# Patient Record
Sex: Female | Born: 1960
Health system: Southern US, Community
[De-identification: ages and names within clinical notes are randomized; demographics above are authoritative.]

## PROBLEM LIST (undated history)

## (undated) DIAGNOSIS — C801 Malignant (primary) neoplasm, unspecified: Secondary | ICD-10-CM

## (undated) HISTORY — PX: KNEE ARTHROSCOPY: SUR90

## (undated) HISTORY — PX: MOLE REMOVAL: SHX2046

---

## 2005-03-04 ENCOUNTER — Ambulatory Visit: Payer: Self-pay | Admitting: Family Medicine

## 2006-03-24 ENCOUNTER — Ambulatory Visit: Payer: Self-pay | Admitting: Family Medicine

## 2006-04-04 ENCOUNTER — Ambulatory Visit: Payer: Self-pay | Admitting: Family Medicine

## 2011-01-26 ENCOUNTER — Ambulatory Visit: Payer: Self-pay | Admitting: Internal Medicine

## 2011-04-26 ENCOUNTER — Ambulatory Visit: Payer: Self-pay | Admitting: Internal Medicine

## 2012-06-28 ENCOUNTER — Ambulatory Visit: Payer: Self-pay | Admitting: Internal Medicine

## 2013-07-03 ENCOUNTER — Ambulatory Visit: Payer: Self-pay | Admitting: Internal Medicine

## 2014-10-09 LAB — LIPID PANEL
Cholesterol: 203 mg/dL — AB (ref 0–200)
HDL: 62 mg/dL (ref 35–70)
LDL Cholesterol: 126 mg/dL
Triglycerides: 75 mg/dL (ref 40–160)

## 2014-10-09 LAB — BASIC METABOLIC PANEL
BUN: 19 mg/dL (ref 4–21)
Creatinine: 0.8 mg/dL (ref <=1.1)

## 2014-10-09 LAB — HM PAP SMEAR: HM Pap smear: NORMAL

## 2014-10-09 LAB — HEMOGLOBIN A1C: Hgb A1c MFr Bld: 5.8 % (ref 4.0–6.0)

## 2014-10-09 LAB — TSH: TSH: 2.6 u[IU]/mL (ref <=5.90)

## 2014-10-20 LAB — HM MAMMOGRAPHY: HM Mammogram: NORMAL

## 2014-10-29 ENCOUNTER — Other Ambulatory Visit: Payer: Self-pay

## 2014-10-29 DIAGNOSIS — Z1231 Encounter for screening mammogram for malignant neoplasm of breast: Secondary | ICD-10-CM

## 2014-11-19 ENCOUNTER — Ambulatory Visit
Admission: RE | Admit: 2014-11-19 | Discharge: 2014-11-19 | Disposition: A | Discharge status: Home / Self Care | Payer: BLUE CROSS/BLUE SHIELD | Source: Ambulatory Visit | Attending: Internal Medicine | Admitting: Internal Medicine

## 2014-11-19 DIAGNOSIS — R922 Inconclusive mammogram: Secondary | ICD-10-CM | POA: Diagnosis not present

## 2014-11-19 DIAGNOSIS — Z1231 Encounter for screening mammogram for malignant neoplasm of breast: Secondary | ICD-10-CM | POA: Insufficient documentation

## 2015-02-19 ENCOUNTER — Other Ambulatory Visit: Payer: Self-pay | Admitting: Internal Medicine

## 2015-02-25 LAB — FECAL OCCULT BLOOD, IMMUNOCHEMICAL: Fecal Occult Bld: NEGATIVE

## 2015-05-03 ENCOUNTER — Encounter: Payer: Self-pay | Admitting: Internal Medicine

## 2015-05-06 ENCOUNTER — Other Ambulatory Visit: Payer: Self-pay | Admitting: Internal Medicine

## 2015-05-06 ENCOUNTER — Encounter: Payer: Self-pay | Admitting: Internal Medicine

## 2015-05-06 DIAGNOSIS — R739 Hyperglycemia, unspecified: Secondary | ICD-10-CM | POA: Insufficient documentation

## 2015-05-06 DIAGNOSIS — L659 Nonscarring hair loss, unspecified: Secondary | ICD-10-CM | POA: Insufficient documentation

## 2015-05-06 DIAGNOSIS — K219 Gastro-esophageal reflux disease without esophagitis: Secondary | ICD-10-CM | POA: Insufficient documentation

## 2015-05-06 DIAGNOSIS — L918 Other hypertrophic disorders of the skin: Secondary | ICD-10-CM | POA: Insufficient documentation

## 2015-05-06 DIAGNOSIS — I10 Essential (primary) hypertension: Secondary | ICD-10-CM | POA: Insufficient documentation

## 2015-05-07 ENCOUNTER — Other Ambulatory Visit: Payer: Self-pay

## 2015-05-07 ENCOUNTER — Other Ambulatory Visit: Payer: Self-pay | Admitting: Internal Medicine

## 2015-05-07 MED ORDER — HYDROCHLOROTHIAZIDE 25 MG PO TABS: 25.0000 mg | ORAL_TABLET | Freq: Every day | ORAL | Status: DC

## 2015-10-09 ENCOUNTER — Other Ambulatory Visit: Payer: Self-pay | Admitting: Internal Medicine

## 2015-10-09 DIAGNOSIS — Z1231 Encounter for screening mammogram for malignant neoplasm of breast: Secondary | ICD-10-CM

## 2015-10-24 DIAGNOSIS — Z1283 Encounter for screening for malignant neoplasm of skin: Secondary | ICD-10-CM | POA: Diagnosis not present

## 2015-10-24 DIAGNOSIS — D0359 Melanoma in situ of other part of trunk: Secondary | ICD-10-CM | POA: Diagnosis not present

## 2015-10-24 DIAGNOSIS — L918 Other hypertrophic disorders of the skin: Secondary | ICD-10-CM | POA: Diagnosis not present

## 2015-10-24 DIAGNOSIS — D2372 Other benign neoplasm of skin of left lower limb, including hip: Secondary | ICD-10-CM | POA: Diagnosis not present

## 2015-10-24 DIAGNOSIS — D485 Neoplasm of uncertain behavior of skin: Secondary | ICD-10-CM | POA: Diagnosis not present

## 2015-11-06 ENCOUNTER — Ambulatory Visit (INDEPENDENT_AMBULATORY_CARE_PROVIDER_SITE_OTHER): Payer: BLUE CROSS/BLUE SHIELD | Admitting: Internal Medicine

## 2015-11-06 ENCOUNTER — Encounter: Payer: Self-pay | Admitting: Internal Medicine

## 2015-11-06 VITALS — BP 118/82 | HR 98 | Resp 16 | Ht 61.0 in | Wt 162.0 lb

## 2015-11-06 DIAGNOSIS — Z1211 Encounter for screening for malignant neoplasm of colon: Secondary | ICD-10-CM | POA: Diagnosis not present

## 2015-11-06 DIAGNOSIS — K219 Gastro-esophageal reflux disease without esophagitis: Secondary | ICD-10-CM

## 2015-11-06 DIAGNOSIS — Z0000 Encounter for general adult medical examination without abnormal findings: Secondary | ICD-10-CM

## 2015-11-06 DIAGNOSIS — I10 Essential (primary) hypertension: Secondary | ICD-10-CM

## 2015-11-06 DIAGNOSIS — Z1159 Encounter for screening for other viral diseases: Secondary | ICD-10-CM | POA: Diagnosis not present

## 2015-11-06 DIAGNOSIS — R739 Hyperglycemia, unspecified: Secondary | ICD-10-CM

## 2015-11-06 LAB — POCT URINALYSIS DIPSTICK
Bilirubin, UA: NEGATIVE
Blood, UA: NEGATIVE
Glucose, UA: NEGATIVE
Ketones, UA: NEGATIVE
Leukocytes, UA: NEGATIVE
Nitrite, UA: NEGATIVE
Protein, UA: NEGATIVE
Spec Grav, UA: 1.01
Urobilinogen, UA: 0.2
pH, UA: 6

## 2015-11-06 NOTE — Patient Instructions (Addendum)
Calcium 1200 mg per day Vitamin D 800 IU per day  Colonoscopy A colonoscopy is an exam to look at the entire large intestine (colon). This exam can help find problems such as tumors, polyps, inflammation, and areas of bleeding. The exam takes about 1 hour.  LET [facility] CARE PROVIDER KNOW ABOUT:   Any allergies you have.  All medicines you are taking, including vitamins, herbs, eye drops, creams, and over-the-counter medicines.  Previous problems you or members of your family have had with the use of anesthetics.  Any blood disorders you have.  Previous surgeries you have had.  Medical conditions you have. RISKS AND COMPLICATIONS  Generally, this is a safe procedure. However, as with any procedure, complications can occur. Possible complications include:  Bleeding.  Tearing or rupture of the colon wall.  Reaction to medicines given during the exam.  Infection (rare). BEFORE THE PROCEDURE   Ask your health care provider about changing or stopping your regular medicines.  You may be prescribed an oral bowel prep. This involves drinking a large amount of medicated liquid, starting the day before your procedure. The liquid will cause you to have multiple loose stools until your stool is almost clear or light green. This cleans out your colon in preparation for the procedure.  Do not eat or drink anything else once you have started the bowel prep, unless your health care provider tells you it is safe to do so.  Arrange for someone to drive you home after the procedure. PROCEDURE   You will be given medicine to help you relax (sedative).  You will lie on your side with your knees bent.  A long, flexible tube with a light and camera on the end (colonoscope) will be inserted through the rectum and into the colon. The camera sends video back to a computer screen as it moves through the colon. The colonoscope also releases carbon dioxide gas to inflate the colon. This helps your  health care provider see the area better.  During the exam, your health care provider may take a small tissue sample (biopsy) to be examined under a microscope if any abnormalities are found.  The exam is finished when the entire colon has been viewed. AFTER THE PROCEDURE   Do not drive for 24 hours after the exam.  You may have a small amount of blood in your stool.  You may pass moderate amounts of gas and have mild abdominal cramping or bloating. This is caused by the gas used to inflate your colon during the exam.  Ask when your test results will be ready and how you will get your results. Make sure you get your test results.   This information is not intended to replace advice given to you by your health care provider. Make sure you discuss any questions you have with your health care provider.   Document Released: 06/04/2000 Document Revised: 03/28/2013 Document Reviewed: 02/12/2013 Elsevier Interactive Patient Education [employer].

## 2015-11-06 NOTE — Progress Notes (Signed)
Date:  11/06/2015   Name:  Debra Villa   DOB:  1960-12-08   MRN:  [id number]   Chief Complaint: Annual Exam Debra Villa is a 55 y.o. female who presents today for her Complete Annual Exam. She feels well. She reports exercising none. She reports she is sleeping well. She denies any breast problems.  Mammogram is scheduled.  She is still very hesitant to have a colonoscopy.  She will check on Cologuard coverage.  Hypertension This is a chronic problem. The current episode started more than 1 year ago. The problem is unchanged. The problem is controlled. Pertinent negatives include no chest pain, headaches, palpitations or shortness of breath. Past treatments include diuretics. The current treatment provides significant improvement. There are no compliance problems.  There is no history of kidney disease, CAD/MI, CVA or PVD.  Gastroesophageal Reflux She reports no abdominal pain, no chest pain, no coughing or no wheezing. This is a chronic problem. The problem occurs rarely. The problem has been resolved. Pertinent negatives include no fatigue. She has tried a histamine-2 antagonist for the symptoms.     Review of Systems  Constitutional: Negative for fever, chills and fatigue.  HENT: Negative for congestion, hearing loss, tinnitus, trouble swallowing and voice change.   Eyes: Negative for visual disturbance.  Respiratory: Negative for cough, chest tightness, shortness of breath and wheezing.   Cardiovascular: Negative for chest pain, palpitations and leg swelling.  Gastrointestinal: Negative for vomiting, abdominal pain, diarrhea and constipation.  Endocrine: Negative for polydipsia and polyuria.  Genitourinary: Negative for dysuria, frequency, vaginal bleeding, vaginal discharge and genital sores.  Musculoskeletal: Negative for joint swelling, arthralgias and gait problem.  Skin: Negative for color change and rash.  Allergic/Immunologic: Negative for environmental  allergies.  Neurological: Negative for dizziness, tremors, light-headedness and headaches.  Hematological: Negative for adenopathy. Does not bruise/bleed easily.  Psychiatric/Behavioral: Negative for sleep disturbance and dysphoric mood. The patient is not nervous/anxious.     Patient Active Problem List   Diagnosis Date Noted  . 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 Alopecia 05/06/2015  . Essential (primary) hypertension 05/06/2015  . Gastro-esophageal reflux disease without esophagitis 05/06/2015  . Blood glucose elevated 05/06/2015    Prior to Admission medications   Medication Sig Start Date End Date Taking? Authorizing Provider  Biotin 5 MG CAPS Take by mouth.   Yes Historical Provider, MD  Famotidine (PEPCID AC MAXIMUM STRENGTH) 20 MG CHEW Chew 1 tablet by mouth daily.   Yes Historical Provider, MD  hydrochlorothiazide (HYDRODIURIL) 25 MG tablet Take 1 tablet (25 mg total) by mouth daily. 05/07/15  Yes Glean Hess, MD    No Known Allergies  Past Surgical History  Procedure Laterality Date  . Knee arthroscopy      Social History  Substance Use Topics  . Smoking status: Never Smoker   . Smokeless tobacco: None  . Alcohol Use: 1.2 oz/week    2 Standard drinks or equivalent per week    Medication list has been reviewed and updated.  Physical Exam  Constitutional: She is oriented to person, place, and time. She appears well-developed and well-nourished. No distress.  HENT:  Head: Normocephalic and atraumatic.  Right Ear: Tympanic membrane and ear canal normal.  Left Ear: Tympanic membrane and ear canal normal.  Nose: Right sinus exhibits no maxillary sinus tenderness. Left sinus exhibits no maxillary sinus tenderness.  Mouth/Throat: Uvula is midline and oropharynx is clear and moist.  Eyes: Conjunctivae and EOM are normal. Right  eye exhibits no discharge. Left eye exhibits no discharge. No scleral icterus.  Neck: Normal range of motion. Carotid bruit is not present. No  erythema present. No thyromegaly present.  Cardiovascular: Normal rate, regular rhythm, normal heart sounds and normal pulses.   Pulmonary/Chest: Effort normal. No respiratory distress. She has no wheezes. Right breast exhibits no mass, no nipple discharge, no skin change and no tenderness. Left breast exhibits no mass, no nipple discharge, no skin change and no tenderness.  Abdominal: Soft. Bowel sounds are normal. There is no hepatosplenomegaly. There is no tenderness. There is no CVA tenderness.  Musculoskeletal: Normal range of motion.  Lymphadenopathy:    She has no cervical adenopathy.    She has no axillary adenopathy.  Neurological: She is alert and oriented to person, place, and time. She has normal reflexes. No cranial nerve deficit or sensory deficit.  Skin: Skin is warm and dry. No rash noted.  Skin biopsy site on left thigh and mid back.  Psychiatric: She has a normal mood and affect. Her speech is normal and behavior is normal. Thought content normal.  Nursing note and vitals reviewed.   BP 118/82 mmHg  Pulse 98  Resp 16  Ht 5\' 1"  (1.549 m)  Wt 162 lb (73.483 kg)  BMI 30.63 kg/m2  SpO2 98%  Assessment and Plan: 1. Annual physical exam Recommend regular exercise and Calcium + D supplement - Lipid panel  2. Essential (primary) hypertension controleed - CBC with Differential/Platelet - TSH - POCT urinalysis dipstick  3. Gastro-esophageal reflux disease without esophagitis Controlled with otc medication - CBC with Differential/Platelet  4. Blood glucose elevated Continue healthy diet - Comprehensive metabolic panel - Hemoglobin A1c  5. Colon cancer screening Check on cologuard coverage  6. Need for hepatitis C screening test - Hepatitis C antibody   Halina Maidens, MD [facility]  11/06/2015

## 2015-11-07 LAB — TSH: TSH: 2.06 u[IU]/mL (ref 0.450–4.500)

## 2015-11-07 LAB — COMPREHENSIVE METABOLIC PANEL
ALT: 31 IU/L (ref 0–32)
AST: 22 IU/L (ref 0–40)
Albumin/Globulin Ratio: 1.7 (ref 1.2–2.2)
Albumin: 4.8 g/dL (ref 3.5–5.5)
Alkaline Phosphatase: 102 IU/L (ref 39–117)
BUN/Creatinine Ratio: 24 — ABNORMAL HIGH (ref 9–23)
BUN: 17 mg/dL (ref 6–24)
Bilirubin Total: 0.3 mg/dL (ref 0.0–1.2)
CO2: 25 mmol/L (ref 18–29)
Calcium: 10 mg/dL (ref 8.7–10.2)
Chloride: 98 mmol/L (ref 96–106)
Creatinine, Ser: 0.72 mg/dL (ref 0.57–1.00)
GFR calc Af Amer: 108 mL/min/{1.73_m2} (ref >59)
GFR calc non Af Amer: 93 mL/min/{1.73_m2} (ref >59)
Globulin, Total: 2.8 g/dL (ref 1.5–4.5)
Glucose: 87 mg/dL (ref 65–99)
Potassium: 3.4 mmol/L — ABNORMAL LOW (ref 3.5–5.2)
Sodium: 143 mmol/L (ref 134–144)
Total Protein: 7.6 g/dL (ref 6.0–8.5)

## 2015-11-07 LAB — CBC WITH DIFFERENTIAL/PLATELET
Basophils Absolute: 0 10*3/uL (ref 0.0–0.2)
Basos: 0 %
EOS (ABSOLUTE): 0.3 10*3/uL (ref 0.0–0.4)
Eos: 5 %
Hematocrit: 43.2 % (ref 34.0–46.6)
Hemoglobin: 14.3 g/dL (ref 11.1–15.9)
Immature Grans (Abs): 0 10*3/uL (ref 0.0–0.1)
Immature Granulocytes: 0 %
Lymphocytes Absolute: 1.3 10*3/uL (ref 0.7–3.1)
Lymphs: 23 %
MCH: 29.3 pg (ref 26.6–33.0)
MCHC: 33.1 g/dL (ref 31.5–35.7)
MCV: 89 fL (ref 79–97)
Monocytes Absolute: 0.5 10*3/uL (ref 0.1–0.9)
Monocytes: 8 %
Neutrophils Absolute: 3.5 10*3/uL (ref 1.4–7.0)
Neutrophils: 64 %
Platelets: 269 10*3/uL (ref 150–379)
RBC: 4.88 x10E6/uL (ref 3.77–5.28)
RDW: 14.2 % (ref 12.3–15.4)
WBC: 5.5 10*3/uL (ref 3.4–10.8)

## 2015-11-07 LAB — LIPID PANEL
Chol/HDL Ratio: 3.4 ratio units (ref 0.0–4.4)
Cholesterol, Total: 217 mg/dL — ABNORMAL HIGH (ref 100–199)
HDL: 64 mg/dL (ref >39)
LDL Calculated: 128 mg/dL — ABNORMAL HIGH (ref 0–99)
Triglycerides: 123 mg/dL (ref 0–149)
VLDL Cholesterol Cal: 25 mg/dL (ref 5–40)

## 2015-11-07 LAB — HEMOGLOBIN A1C
Est. average glucose Bld gHb Est-mCnc: 120 mg/dL
Hgb A1c MFr Bld: 5.8 % — ABNORMAL HIGH (ref 4.8–5.6)

## 2015-11-07 LAB — HEPATITIS C ANTIBODY: Hep C Virus Ab: 0.1 s/co ratio (ref 0.0–0.9)

## 2015-11-18 ENCOUNTER — Encounter: Payer: Self-pay | Admitting: Internal Medicine

## 2015-11-18 ENCOUNTER — Ambulatory Visit (INDEPENDENT_AMBULATORY_CARE_PROVIDER_SITE_OTHER): Payer: BLUE CROSS/BLUE SHIELD | Admitting: Internal Medicine

## 2015-11-18 VITALS — BP 142/86 | HR 92 | Temp 100.8°F | Resp 16 | Ht 61.0 in | Wt 162.0 lb

## 2015-11-18 DIAGNOSIS — J019 Acute sinusitis, unspecified: Secondary | ICD-10-CM

## 2015-11-18 MED ORDER — AMOXICILLIN-POT CLAVULANATE 875-125 MG PO TABS: 1.0000 | ORAL_TABLET | Freq: Two times a day (BID) | ORAL | Status: DC

## 2015-11-18 MED ORDER — GUAIFENESIN-CODEINE 100-10 MG/5ML PO SYRP
5.0000 mL | ORAL_SOLUTION | Freq: Three times a day (TID) | ORAL | Status: DC | PRN
Start: 2015-11-18 — End: 2016-06-06

## 2015-11-18 NOTE — Patient Instructions (Signed)

## 2015-11-18 NOTE — Progress Notes (Signed)
Date:  11/18/2015   Name:  Debra Villa   DOB:  22-Mar-1961   MRN:  [id number]   Chief Complaint: Cough; Fever; Headache; and Nausea Cough This is a new problem. The current episode started in the past 7 days. The cough is productive of sputum. Associated symptoms include a fever and headaches.  Fever  This is a new problem. The maximum temperature noted was 100 to 100.9 F. Associated symptoms include coughing and headaches. She has tried acetaminophen and NSAIDs for the symptoms.  Headache  This is a new problem. The current episode started in the past 7 days. The problem occurs constantly. Associated symptoms include coughing and a fever.   She has started blowing out green mucus and coughing up the same.  Her fever is responding to tylenol and her headache is improved with sudafed.   Review of Systems  Constitutional: Positive for fever.  Respiratory: Positive for cough.   Neurological: Positive for headaches.    Patient Active Problem List   Diagnosis Date Noted  . 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 Alopecia 05/06/2015  . Essential (primary) hypertension 05/06/2015  . Gastro-esophageal reflux disease without esophagitis 05/06/2015  . Blood glucose elevated 05/06/2015    Prior to Admission medications   Medication Sig Start Date End Date Taking? Authorizing Provider  Biotin 5 MG CAPS Take by mouth.   Yes Historical Provider, MD  Famotidine (PEPCID AC MAXIMUM STRENGTH) 20 MG CHEW Chew 1 tablet by mouth daily.   Yes Historical Provider, MD  hydrochlorothiazide (HYDRODIURIL) 25 MG tablet Take 1 tablet (25 mg total) by mouth daily. 05/07/15  Yes Glean Hess, MD    No Known Allergies  Past Surgical History  Procedure Laterality Date  . Knee arthroscopy      Social History  Substance Use Topics  . Smoking status: Never Smoker   . Smokeless tobacco: None  . Alcohol Use: 1.2 oz/week    2 Standard drinks or equivalent per week     Medication list has been reviewed  and updated.   Physical Exam  Constitutional: She is oriented to person, place, and time. She appears well-developed and well-nourished.  HENT:  Right Ear: External ear and ear canal normal. Tympanic membrane is not erythematous and not retracted.  Left Ear: External ear and ear canal normal. Tympanic membrane is not erythematous and not retracted.  Nose: Right sinus exhibits maxillary sinus tenderness and frontal sinus tenderness. Left sinus exhibits maxillary sinus tenderness and frontal sinus tenderness.  Mouth/Throat: Uvula is midline and mucous membranes are normal. No oral lesions. Posterior oropharyngeal erythema present. No oropharyngeal exudate.  Neck: Normal range of motion. No thyromegaly present.  Cardiovascular: Normal rate, regular rhythm and normal heart sounds.   Pulmonary/Chest: Breath sounds normal. She has no wheezes. She has no rales.  Lymphadenopathy:    She has no cervical adenopathy.  Neurological: She is alert and oriented to person, place, and time.  Nursing note and vitals reviewed.   BP 142/86 mmHg  Pulse 92  Temp(Src) 100.8 F (38.2 C) (Oral)  Resp 16  Ht 5\' 1"  (1.549 m)  Wt 162 lb (73.483 kg)  BMI 30.63 kg/m2  SpO2 97%  Assessment and Plan: 1. Acute sinusitis, recurrence not specified, unspecified location Continue sudafed and tylenol Increase fluids Pt declines flonase Rx - she can not tolerate nasal sprays  - amoxicillin-clavulanate (AUGMENTIN) 875-125 MG tablet; Take 1 tablet by mouth 2 (two) times daily.  Dispense: 20 tablet; Refill: 0 -  guaiFENesin-codeine (ROBITUSSIN AC) 100-10 MG/5ML syrup; Take 5 mLs by mouth 3 (three) times daily as needed for cough.  Dispense: 150 mL; Refill: 0   Halina Maidens, MD [facility]  11/18/2015

## 2015-11-20 ENCOUNTER — Ambulatory Visit
Admission: RE | Admit: 2015-11-20 | Discharge: 2015-11-20 | Disposition: A | Discharge status: Home / Self Care | Payer: BLUE CROSS/BLUE SHIELD | Source: Ambulatory Visit | Attending: Internal Medicine | Admitting: Internal Medicine

## 2015-11-20 DIAGNOSIS — Z1231 Encounter for screening mammogram for malignant neoplasm of breast: Secondary | ICD-10-CM | POA: Insufficient documentation

## 2015-11-26 ENCOUNTER — Other Ambulatory Visit: Payer: Self-pay | Admitting: Internal Medicine

## 2015-11-26 DIAGNOSIS — D2272 Melanocytic nevi of left lower limb, including hip: Secondary | ICD-10-CM | POA: Diagnosis not present

## 2015-11-26 DIAGNOSIS — D485 Neoplasm of uncertain behavior of skin: Secondary | ICD-10-CM | POA: Diagnosis not present

## 2015-11-26 DIAGNOSIS — D0359 Melanoma in situ of other part of trunk: Secondary | ICD-10-CM | POA: Diagnosis not present

## 2015-11-26 MED ORDER — HYDROCODONE-HOMATROPINE 5-1.5 MG/5ML PO SYRP: 5.0000 mL | ORAL_SOLUTION | Freq: Four times a day (QID) | ORAL | Status: DC | PRN

## 2015-12-08 ENCOUNTER — Other Ambulatory Visit: Payer: Self-pay | Admitting: Internal Medicine

## 2015-12-08 DIAGNOSIS — Z1212 Encounter for screening for malignant neoplasm of rectum: Secondary | ICD-10-CM | POA: Diagnosis not present

## 2015-12-08 DIAGNOSIS — Z1211 Encounter for screening for malignant neoplasm of colon: Secondary | ICD-10-CM | POA: Diagnosis not present

## 2015-12-09 LAB — COLOGUARD

## 2015-12-10 ENCOUNTER — Ambulatory Visit (INDEPENDENT_AMBULATORY_CARE_PROVIDER_SITE_OTHER): Payer: BLUE CROSS/BLUE SHIELD | Admitting: Internal Medicine

## 2015-12-10 ENCOUNTER — Encounter: Payer: Self-pay | Admitting: Internal Medicine

## 2015-12-10 VITALS — BP 122/80 | HR 91 | Temp 98.1°F | Resp 16 | Ht 61.0 in | Wt 162.0 lb

## 2015-12-10 DIAGNOSIS — J029 Acute pharyngitis, unspecified: Secondary | ICD-10-CM

## 2015-12-10 MED ORDER — AZITHROMYCIN 250 MG PO TABS: ORAL_TABLET | ORAL | Status: DC

## 2015-12-10 NOTE — Progress Notes (Signed)
Date:  12/10/2015   Name:  Debra Villa   DOB:  06/11/1961   MRN:  [id number]   Chief Complaint: Sore Throat Sore Throat  This is a new problem. The current episode started in the past 7 days. The problem has been unchanged. Neither side of throat is experiencing more pain than the other. There has been no fever. The pain is mild. Associated symptoms include coughing and trouble swallowing. Pertinent negatives include no ear pain, headaches, hoarse voice or shortness of breath. She has had exposure to strep. She has tried acetaminophen for the symptoms. The treatment provided mild relief.    Review of Systems  Constitutional: Negative for fever, chills and fatigue.  HENT: Positive for sore throat and trouble swallowing. Negative for ear pain, hoarse voice, sinus pressure and tinnitus.   Respiratory: Positive for cough. Negative for chest tightness and shortness of breath.   Cardiovascular: Negative for chest pain, palpitations and leg swelling.  Neurological: Negative for dizziness and headaches.    Patient Active Problem List   Diagnosis Date Noted  . 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 Alopecia 05/06/2015  . Essential (primary) hypertension 05/06/2015  . Gastro-esophageal reflux disease without esophagitis 05/06/2015  . Blood glucose elevated 05/06/2015    Prior to Admission medications   Medication Sig Start Date End Date Taking? Authorizing Provider  Biotin 5 MG CAPS Take by mouth.   Yes Historical Provider, MD  Famotidine (PEPCID AC MAXIMUM STRENGTH) 20 MG CHEW Chew 1 tablet by mouth daily.   Yes Historical Provider, MD  guaiFENesin-codeine (ROBITUSSIN AC) 100-10 MG/5ML syrup Take 5 mLs by mouth 3 (three) times daily as needed for cough. 11/18/15  Yes Glean Hess, MD  hydrochlorothiazide (HYDRODIURIL) 25 MG tablet TAKE ONE (1) TABLET BY MOUTH EVERY DAY 12/08/15  Yes Glean Hess, MD  HYDROcodone-homatropine [facility]) 5-1.5 MG/5ML syrup Take 5 mLs by mouth every 6 (six)  hours as needed for cough. 11/26/15  Yes Glean Hess, MD    No Known Allergies  Past Surgical History  Procedure Laterality Date  . Knee arthroscopy    . Mole removal      Social History  Substance Use Topics  . Smoking status: Never Smoker   . Smokeless tobacco: None  . Alcohol Use: 1.2 oz/week    2 Standard drinks or equivalent per week     Medication list has been reviewed and updated.   Physical Exam  Constitutional: She is oriented to person, place, and time. She appears well-developed and well-nourished.  HENT:  Right Ear: External ear and ear canal normal. Tympanic membrane is not erythematous and not retracted.  Left Ear: External ear and ear canal normal. Tympanic membrane is not erythematous and not retracted.  Nose: Right sinus exhibits no maxillary sinus tenderness and no frontal sinus tenderness. Left sinus exhibits no maxillary sinus tenderness and no frontal sinus tenderness.  Mouth/Throat: Uvula is midline and mucous membranes are normal. No oral lesions. Posterior oropharyngeal edema and posterior oropharyngeal erythema present. No oropharyngeal exudate.    Cardiovascular: Normal rate, regular rhythm and normal heart sounds.   Pulmonary/Chest: Breath sounds normal. She has no wheezes. She has no rales.  Lymphadenopathy:    She has no cervical adenopathy.  Neurological: She is alert and oriented to person, place, and time.    BP 122/80 mmHg  Pulse 91  Temp(Src) 98.1 F (36.7 C)  Resp 16  Ht 5\' 1"  (1.549 m)  Wt 162 lb (73.483 kg)  BMI 30.63 kg/m2  SpO2 100%  Assessment and Plan: 1. Pharyngitis With tonsil stones contributing to discomfort Continue tylenol as needed Gargle with saline bid - azithromycin (ZITHROMAX Z-PAK) 250 MG tablet; Take 2 tablets on day #1 and then 1 tablet daily for 4 more days.  Dispense: 6 each; Refill: 0   Halina Maidens, MD [facility]  12/10/2015

## 2015-12-17 LAB — COLOGUARD: Cologuard: NEGATIVE

## 2015-12-25 ENCOUNTER — Telehealth: Payer: Self-pay

## 2015-12-25 NOTE — Telephone Encounter (Signed)
Advised...Marland KitchenMarland KitchenCologaurd Neg repeat in 3 years unless complications arise.

## 2016-06-06 ENCOUNTER — Ambulatory Visit
Admission: EM | Admit: 2016-06-06 | Discharge: 2016-06-06 | Disposition: A | Discharge status: Home / Self Care | Payer: BLUE CROSS/BLUE SHIELD | Attending: Family Medicine | Admitting: Family Medicine

## 2016-06-06 ENCOUNTER — Encounter: Payer: Self-pay | Admitting: Gynecology

## 2016-06-06 DIAGNOSIS — J0140 Acute pansinusitis, unspecified: Secondary | ICD-10-CM | POA: Diagnosis not present

## 2016-06-06 MED ORDER — AMOXICILLIN-POT CLAVULANATE 875-125 MG PO TABS: 1.0000 | ORAL_TABLET | Freq: Two times a day (BID) | ORAL | 0 refills | Status: DC

## 2016-06-06 MED ORDER — AMOXICILLIN-POT CLAVULANATE 875-125 MG PO TABS: 1.0000 | ORAL_TABLET | Freq: Two times a day (BID) | ORAL | 0 refills | Status: AC

## 2016-06-06 NOTE — Discharge Instructions (Signed)
Start Augmentin twice a day as directed. May take Sudafed 30mg  every 8 hours as needed for congestion. Increase fluid intake to help loosen up mucus. Follow-up in 3 to 4 days with your primary care provider if not improving.

## 2016-06-06 NOTE — ED Provider Notes (Signed)
CSN: [id number]     Arrival date & time 06/06/16  1236 History   First MD Initiated Contact with Patient 06/06/16 1453     Chief Complaint  Patient presents with  . URI   (Consider location/radiation/quality/duration/timing/severity/associated sxs/prior Treatment) 55 year old female presents with nasal congestion, sinus pressure, headache, cough for the past 5 days. Getting worse with low grade fever, chills, laryngitis. Denies any GI symptoms. Has taken OTC Advil sinus with minimal relief. Has history of HTN but forgot to take her BP medication today.    The history is provided by the patient.    History reviewed. No pertinent past medical history. Past Surgical History:  Procedure Laterality Date  . KNEE ARTHROSCOPY    . MOLE REMOVAL     Family History  Problem Relation Age of Onset  . Hypertension Mother   . Hypertension Father   . CAD Father   . Hypertension Brother   . Breast cancer Neg Hx    Social History  Substance Use Topics  . Smoking status: Never Smoker  . Smokeless tobacco: Never Used  . Alcohol use 1.2 oz/week    2 Standard drinks or equivalent per week   OB History    No data available     Review of Systems  Constitutional: Positive for chills, fatigue and fever. Negative for appetite change.  HENT: Positive for congestion, sinus pain, sinus pressure, sore throat and voice change. Negative for ear pain.   Eyes: Negative for discharge.  Respiratory: Positive for cough. Negative for chest tightness, shortness of breath and wheezing.   Cardiovascular: Negative for chest pain.  Gastrointestinal: Negative for abdominal pain, diarrhea, nausea and vomiting.  Musculoskeletal: Negative for neck pain and neck stiffness.  Skin: Negative for rash.  Neurological: Positive for headaches. Negative for dizziness, syncope, weakness and light-headedness.  Hematological: Negative for adenopathy.    Allergies  Patient has no known allergies.  Home Medications    Prior to Admission medications   Medication Sig Start Date End Date Taking? Authorizing Provider  Biotin 5 MG CAPS Take by mouth.   Yes Historical Provider, MD  Famotidine (PEPCID AC MAXIMUM STRENGTH) 20 MG CHEW Chew 1 tablet by mouth daily.   Yes Historical Provider, MD  hydrochlorothiazide (HYDRODIURIL) 25 MG tablet TAKE ONE (1) TABLET BY MOUTH EVERY DAY 12/08/15  Yes Glean Hess, MD  amoxicillin-clavulanate (AUGMENTIN) 875-125 MG tablet Take 1 tablet by mouth every 12 (twelve) hours. 06/06/16 06/13/16  Katy Apo, NP   Meds Ordered and Administered this Visit  Medications - No data to display  BP (!) 154/79 (BP Location: Left Arm)   Pulse 94   Temp 98.5 F (36.9 C) (Oral)   Resp 16   Ht 5\' 1"  (1.549 m)   Wt 155 lb (70.3 kg)   SpO2 99%   BMI 29.29 kg/m  No data found.   Physical Exam  Constitutional: She is oriented to person, place, and time. She appears well-developed and well-nourished. No distress.  HENT:  Head: Normocephalic and atraumatic.  Right Ear: Hearing, tympanic membrane, external ear and ear canal normal.  Left Ear: Hearing, tympanic membrane, external ear and ear canal normal.  Nose: Mucosal edema and rhinorrhea present. Right sinus exhibits maxillary sinus tenderness and frontal sinus tenderness. Left sinus exhibits maxillary sinus tenderness and frontal sinus tenderness.  Mouth/Throat: Uvula is midline and mucous membranes are normal. Posterior oropharyngeal erythema present.  Neck: Normal range of motion. Neck supple.  Cardiovascular: Normal rate, regular  rhythm and normal heart sounds.   Pulmonary/Chest: Effort normal. No respiratory distress. She has no decreased breath sounds. She has no wheezes. She has rhonchi in the right upper field and the left upper field.  Lymphadenopathy:    She has no cervical adenopathy.  Neurological: She is alert and oriented to person, place, and time.  Skin: Skin is warm and dry.  Psychiatric: She has a normal  mood and affect. Her behavior is normal. Judgment and thought content normal.    Urgent Care Course   Clinical Course     Procedures (including critical care time)  Labs Review Labs Reviewed - No data to display  Imaging Review No results found.   Visual Acuity Review  Right Eye Distance:   Left Eye Distance:   Bilateral Distance:    Right Eye Near:   Left Eye Near:    Bilateral Near:         MDM   1. Acute non-recurrent pansinusitis    Recommend start Augmentin 875mg  twice a day as directed. May take Sudafed 30mg  every 8 hours as needed for congestion but continue to monitor BP since Sudafed can increase BP. Recommend increase fluid intake to help loosen up mucus. Follow-up with her primary care provider in 4 to 5 days if not improving.      Katy Apo, NP 06/07/16 1032

## 2016-06-06 NOTE — ED Triage Notes (Signed)
Per patient upper respiration infection x 3 days,

## 2016-07-01 DIAGNOSIS — D229 Melanocytic nevi, unspecified: Secondary | ICD-10-CM | POA: Diagnosis not present

## 2016-07-01 DIAGNOSIS — Z85820 Personal history of malignant melanoma of skin: Secondary | ICD-10-CM | POA: Diagnosis not present

## 2016-07-01 DIAGNOSIS — D485 Neoplasm of uncertain behavior of skin: Secondary | ICD-10-CM | POA: Diagnosis not present

## 2016-07-22 DIAGNOSIS — D485 Neoplasm of uncertain behavior of skin: Secondary | ICD-10-CM | POA: Diagnosis not present

## 2016-07-22 DIAGNOSIS — D229 Melanocytic nevi, unspecified: Secondary | ICD-10-CM | POA: Diagnosis not present

## 2016-07-23 DIAGNOSIS — Z23 Encounter for immunization: Secondary | ICD-10-CM | POA: Diagnosis not present

## 2016-10-27 ENCOUNTER — Other Ambulatory Visit: Payer: Self-pay | Admitting: Internal Medicine

## 2016-10-27 DIAGNOSIS — Z1231 Encounter for screening mammogram for malignant neoplasm of breast: Secondary | ICD-10-CM

## 2016-11-22 ENCOUNTER — Ambulatory Visit
Admission: RE | Admit: 2016-11-22 | Discharge: 2016-11-22 | Disposition: A | Discharge status: Home / Self Care | Payer: BLUE CROSS/BLUE SHIELD | Source: Ambulatory Visit | Attending: Internal Medicine | Admitting: Internal Medicine

## 2016-11-22 DIAGNOSIS — Z1231 Encounter for screening mammogram for malignant neoplasm of breast: Secondary | ICD-10-CM | POA: Insufficient documentation

## 2016-11-22 HISTORY — DX: Malignant (primary) neoplasm, unspecified: C80.1

## 2016-11-24 DIAGNOSIS — Z86018 Personal history of other benign neoplasm: Secondary | ICD-10-CM | POA: Diagnosis not present

## 2016-11-24 DIAGNOSIS — D239 Other benign neoplasm of skin, unspecified: Secondary | ICD-10-CM | POA: Diagnosis not present

## 2016-11-24 DIAGNOSIS — Z85820 Personal history of malignant melanoma of skin: Secondary | ICD-10-CM | POA: Diagnosis not present

## 2016-11-24 DIAGNOSIS — D485 Neoplasm of uncertain behavior of skin: Secondary | ICD-10-CM | POA: Diagnosis not present

## 2016-12-03 ENCOUNTER — Ambulatory Visit (INDEPENDENT_AMBULATORY_CARE_PROVIDER_SITE_OTHER): Payer: BLUE CROSS/BLUE SHIELD | Admitting: Internal Medicine

## 2016-12-03 ENCOUNTER — Telehealth: Payer: Self-pay

## 2016-12-03 ENCOUNTER — Encounter: Payer: Self-pay | Admitting: Internal Medicine

## 2016-12-03 VITALS — BP 132/78 | HR 96 | Ht 61.0 in | Wt 157.0 lb

## 2016-12-03 DIAGNOSIS — R739 Hyperglycemia, unspecified: Secondary | ICD-10-CM | POA: Diagnosis not present

## 2016-12-03 DIAGNOSIS — Z1211 Encounter for screening for malignant neoplasm of colon: Secondary | ICD-10-CM

## 2016-12-03 DIAGNOSIS — Z0000 Encounter for general adult medical examination without abnormal findings: Secondary | ICD-10-CM

## 2016-12-03 DIAGNOSIS — K219 Gastro-esophageal reflux disease without esophagitis: Secondary | ICD-10-CM | POA: Diagnosis not present

## 2016-12-03 DIAGNOSIS — D039 Melanoma in situ, unspecified: Secondary | ICD-10-CM | POA: Diagnosis not present

## 2016-12-03 DIAGNOSIS — I10 Essential (primary) hypertension: Secondary | ICD-10-CM | POA: Diagnosis not present

## 2016-12-03 LAB — POCT URINALYSIS DIPSTICK
Bilirubin, UA: NEGATIVE
Blood, UA: NEGATIVE
Glucose, UA: NEGATIVE
Ketones, UA: NEGATIVE
Leukocytes, UA: NEGATIVE
Nitrite, UA: NEGATIVE
Protein, UA: NEGATIVE
Spec Grav, UA: <=1.005 — AB (ref 1.010–1.025)
Urobilinogen, UA: 0.2 E.U./dL
pH, UA: 5 (ref 5.0–8.0)

## 2016-12-03 MED ORDER — HYDROCHLOROTHIAZIDE 25 MG PO TABS: 25.0000 mg | ORAL_TABLET | Freq: Every day | ORAL | 3 refills | Status: DC

## 2016-12-03 NOTE — Telephone Encounter (Signed)
Error

## 2016-12-03 NOTE — Patient Instructions (Signed)
DASH Eating Plan DASH stands for "Dietary Approaches to Stop Hypertension." The DASH eating plan is a healthy eating plan that has been shown to reduce high blood pressure (hypertension). It may also reduce your risk for type 2 diabetes, heart disease, and stroke. The DASH eating plan may also help with weight loss. What are tips for following this plan? General guidelines  Avoid eating more than 2,300 mg (milligrams) of salt (sodium) a day. If you have hypertension, you may need to reduce your sodium intake to 1,500 mg a day.  Limit alcohol intake to no more than 1 drink a day for nonpregnant women and 2 drinks a day for men. One drink equals 12 oz of beer, 5 oz of wine, or 1 oz of hard liquor.  Work with your health care provider to maintain a healthy body weight or to lose weight. Ask what an ideal weight is for you.  Get at least 30 minutes of exercise that causes your heart to beat faster (aerobic exercise) most days of the week. Activities may include walking, swimming, or biking.  Work with your health care provider or diet and nutrition specialist (dietitian) to adjust your eating plan to your individual calorie needs. Reading food labels  Check food labels for the amount of sodium per serving. Choose foods with less than 5 percent of the Daily Value of sodium. Generally, foods with less than 300 mg of sodium per serving fit into this eating plan.  To find whole grains, look for the word "whole" as the first word in the ingredient list. Shopping  Buy products labeled as "low-sodium" or "no salt added."  Buy fresh foods. Avoid canned foods and premade or frozen meals. Cooking  Avoid adding salt when cooking. Use salt-free seasonings or herbs instead of table salt or sea salt. Check with your health care provider or pharmacist before using salt substitutes.  Do not fry foods. Cook foods using healthy methods such as baking, boiling, grilling, and broiling instead.  Cook with  heart-healthy oils, such as olive, canola, soybean, or sunflower oil. Meal planning   Eat a balanced diet that includes: ? 5 or more servings of fruits and vegetables each day. At each meal, try to fill half of your plate with fruits and vegetables. ? Up to 6-8 servings of whole grains each day. ? Less than 6 oz of lean meat, poultry, or fish each day. A 3-oz serving of meat is about the same size as a deck of cards. One egg equals 1 oz. ? 2 servings of low-fat dairy each day. ? A serving of nuts, seeds, or beans 5 times each week. ? Heart-healthy fats. Healthy fats called Omega-3 fatty acids are found in foods such as flaxseeds and coldwater fish, like sardines, salmon, and mackerel.  Limit how much you eat of the following: ? Canned or prepackaged foods. ? Food that is high in trans fat, such as fried foods. ? Food that is high in saturated fat, such as fatty meat. ? Sweets, desserts, sugary drinks, and other foods with added sugar. ? Full-fat dairy products.  Do not salt foods before eating.  Try to eat at least 2 vegetarian meals each week.  Eat more home-cooked food and less restaurant, buffet, and fast food.  When eating at a restaurant, ask that your food be prepared with less salt or no salt, if possible. What foods are recommended? The items listed may not be a complete list. Talk with your dietitian about what   dietary choices are best for you. Grains Whole-grain or whole-wheat bread. Whole-grain or whole-wheat pasta. Brown rice. Oatmeal. Quinoa. Bulgur. Whole-grain and low-sodium cereals. Pita bread. Low-fat, low-sodium crackers. Whole-wheat flour tortillas. Vegetables Fresh or frozen vegetables (raw, steamed, roasted, or grilled). Low-sodium or reduced-sodium tomato and vegetable juice. Low-sodium or reduced-sodium tomato sauce and tomato paste. Low-sodium or reduced-sodium canned vegetables. Fruits All fresh, dried, or frozen fruit. Canned fruit in natural juice (without  added sugar). Meat and other protein foods Skinless chicken or turkey. Ground chicken or turkey. Pork with fat trimmed off. Fish and seafood. Egg whites. Dried beans, peas, or lentils. Unsalted nuts, nut butters, and seeds. Unsalted canned beans. Lean cuts of beef with fat trimmed off. Low-sodium, lean deli meat. Dairy Low-fat (1%) or fat-free (skim) milk. Fat-free, low-fat, or reduced-fat cheeses. Nonfat, low-sodium ricotta or cottage cheese. Low-fat or nonfat yogurt. Low-fat, low-sodium cheese. Fats and oils Soft margarine without trans fats. Vegetable oil. Low-fat, reduced-fat, or light mayonnaise and salad dressings (reduced-sodium). Canola, safflower, olive, soybean, and sunflower oils. Avocado. Seasoning and other foods Herbs. Spices. Seasoning mixes without salt. Unsalted popcorn and pretzels. Fat-free sweets. What foods are not recommended? The items listed may not be a complete list. Talk with your dietitian about what dietary choices are best for you. Grains Baked goods made with fat, such as croissants, muffins, or some breads. Dry pasta or rice meal packs. Vegetables Creamed or fried vegetables. Vegetables in a cheese sauce. Regular canned vegetables (not low-sodium or reduced-sodium). Regular canned tomato sauce and paste (not low-sodium or reduced-sodium). Regular tomato and vegetable juice (not low-sodium or reduced-sodium). Pickles. Olives. Fruits Canned fruit in a light or heavy syrup. Fried fruit. Fruit in cream or butter sauce. Meat and other protein foods Fatty cuts of meat. Ribs. Fried meat. Bacon. Sausage. Bologna and other processed lunch meats. Salami. Fatback. Hotdogs. Bratwurst. Salted nuts and seeds. Canned beans with added salt. Canned or smoked fish. Whole eggs or egg yolks. Chicken or turkey with skin. Dairy Whole or 2% milk, cream, and half-and-half. Whole or full-fat cream cheese. Whole-fat or sweetened yogurt. Full-fat cheese. Nondairy creamers. Whipped toppings.  Processed cheese and cheese spreads. Fats and oils Butter. Stick margarine. Lard. Shortening. Ghee. Bacon fat. Tropical oils, such as coconut, palm kernel, or palm oil. Seasoning and other foods Salted popcorn and pretzels. Onion salt, garlic salt, seasoned salt, table salt, and sea salt. Worcestershire sauce. Tartar sauce. Barbecue sauce. Teriyaki sauce. Soy sauce, including reduced-sodium. Steak sauce. Canned and packaged gravies. Fish sauce. Oyster sauce. Cocktail sauce. Horseradish that you find on the shelf. Ketchup. Mustard. Meat flavorings and tenderizers. Bouillon cubes. Hot sauce and Tabasco sauce. Premade or packaged marinades. Premade or packaged taco seasonings. Relishes. Regular salad dressings. Where to find more information:  National Heart, Lung, and Blood Institute: www.nhlbi.nih.gov  American Heart Association: www.heart.org Summary  The DASH eating plan is a healthy eating plan that has been shown to reduce high blood pressure (hypertension). It may also reduce your risk for type 2 diabetes, heart disease, and stroke.  With the DASH eating plan, you should limit salt (sodium) intake to 2,300 mg a day. If you have hypertension, you may need to reduce your sodium intake to 1,500 mg a day.  When on the DASH eating plan, aim to eat more fresh fruits and vegetables, whole grains, lean proteins, low-fat dairy, and heart-healthy fats.  Work with your health care provider or diet and nutrition specialist (dietitian) to adjust your eating plan to your individual   calorie needs. This information is not intended to replace advice given to you by your health care provider. Make sure you discuss any questions you have with your health care provider. Document Released: 05/27/2011 Document Revised: 05/31/2016 Document Reviewed: 05/31/2016 Elsevier Interactive Patient Education  2017 Elsevier Inc.  

## 2016-12-03 NOTE — Progress Notes (Signed)
Date:  12/03/2016   Name:  Debra Villa   DOB:  July 02, 1960   MRN:  [id number]   Chief Complaint: Annual Exam (No pap. Breast Exam. ) Debra Villa is a 56 y.o. female who presents today for her Complete Annual Exam. She feels well. She reports exercising regularly. She reports she is sleeping well. Pap was normal in 2016.  Mammogram was just completed and was normal.  Hypertension  Pertinent negatives include no chest pain, headaches, palpitations or shortness of breath.  Gastroesophageal Reflux  She reports no abdominal pain, no chest pain, no coughing or no wheezing. Pertinent negatives include no fatigue.   Pre-diabetes - last A1C 5.8 and stable from 2 years ago.   Review of Systems  Constitutional: Negative for chills, fatigue and fever.  HENT: Negative for congestion, hearing loss, tinnitus, trouble swallowing and voice change.   Eyes: Negative for visual disturbance.  Respiratory: Negative for cough, chest tightness, shortness of breath and wheezing.   Cardiovascular: Negative for chest pain, palpitations and leg swelling.  Gastrointestinal: Negative for abdominal pain, constipation, diarrhea and vomiting.  Endocrine: Negative for polydipsia and polyuria.  Genitourinary: Negative for dysuria, frequency, genital sores, vaginal bleeding and vaginal discharge.  Musculoskeletal: Negative for arthralgias, gait problem and joint swelling.  Skin: Negative for color change and rash.  Neurological: Negative for dizziness, tremors, light-headedness and headaches.  Hematological: Negative for adenopathy. Does not bruise/bleed easily.  Psychiatric/Behavioral: Negative for dysphoric mood and sleep disturbance. The patient is not nervous/anxious.     Patient Active Problem List   Diagnosis Date Noted  . Melanoma in situ ([place]) 12/03/2016  . 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 Alopecia 05/06/2015  . Essential (primary) hypertension 05/06/2015  . Gastro-esophageal reflux disease  without esophagitis 05/06/2015  . Blood glucose elevated 05/06/2015    Prior to Admission medications   Medication Sig Start Date End Date Taking? Authorizing Provider  Biotin 5 MG CAPS Take 2,500 capsules by mouth.    Yes [provider]  cholecalciferol (VITAMIN D) 1000 units tablet Take 2,000 Units by mouth daily.    Yes [provider]  Famotidine (PEPCID AC MAXIMUM STRENGTH) 20 MG CHEW Chew 1 tablet by mouth daily.   Yes [provider]  hydrochlorothiazide (HYDRODIURIL) 25 MG tablet TAKE ONE (1) TABLET BY MOUTH EVERY DAY 12/08/15  Yes Glean Hess, MD    No Known Allergies  Past Surgical History:  Procedure Laterality Date  . KNEE ARTHROSCOPY    . MOLE REMOVAL      Social History  Substance Use Topics  . Smoking status: Never Smoker  . Smokeless tobacco: Never Used  . Alcohol use 1.2 oz/week    2 Standard drinks or equivalent per week   Depression screen [facility] 2/9 12/03/2016 11/06/2015  Decreased Interest 0 0  Down, Depressed, Hopeless 0 0  PHQ - 2 Score 0 0     Medication list has been reviewed and updated.   Physical Exam  Constitutional: She is oriented to person, place, and time. She appears well-developed and well-nourished. No distress.  HENT:  Head: Normocephalic and atraumatic.  Right Ear: Tympanic membrane and ear canal normal.  Left Ear: Tympanic membrane and ear canal normal.  Nose: Right sinus exhibits no maxillary sinus tenderness. Left sinus exhibits no maxillary sinus tenderness.  Mouth/Throat: Uvula is midline and oropharynx is clear and moist.  Eyes: Conjunctivae and EOM are normal. Right eye exhibits no discharge. Left eye exhibits no discharge. No  scleral icterus.  Neck: Normal range of motion. Carotid bruit is not present. No erythema present. No thyromegaly present.  Cardiovascular: Normal rate, regular rhythm, normal heart sounds and normal pulses.   Pulmonary/Chest: Effort normal. No respiratory distress. She has  no wheezes. Right breast exhibits no mass, no nipple discharge, no skin change and no tenderness. Left breast exhibits no mass, no nipple discharge, no skin change and no tenderness.  Abdominal: Soft. Bowel sounds are normal. There is no hepatosplenomegaly. There is no tenderness. There is no CVA tenderness.  Musculoskeletal: Normal range of motion.  Lymphadenopathy:    She has no cervical adenopathy.    She has no axillary adenopathy.  Neurological: She is alert and oriented to person, place, and time. She has normal reflexes. No cranial nerve deficit or sensory deficit.  Skin: Skin is warm, dry and intact. No rash noted.  Psychiatric: She has a normal mood and affect. Her speech is normal and behavior is normal. Thought content normal.  Nursing note and vitals reviewed.   BP 132/78   Pulse 96   Ht 5\' 1"  (1.549 m)   Wt 157 lb (71.2 kg)   SpO2 99%   BMI 29.66 kg/m   Assessment and Plan: 1. Annual physical exam Normal exam  2. Colon cancer screening cologard completed last year  3. Essential (primary) hypertension controlled  4. Gastro-esophageal reflux disease without esophagitis Stable on H2 blocker  5. Blood glucose elevated Check A1C  6. Melanoma in situ, unspecified site [facility]) Followed by Dermatology   No orders of the defined types were placed in this encounter.   Halina Maidens, MD [facility]  12/03/2016

## 2016-12-04 LAB — COMPREHENSIVE METABOLIC PANEL
ALT: 27 IU/L (ref 0–32)
AST: 25 IU/L (ref 0–40)
Albumin/Globulin Ratio: 1.8 (ref 1.2–2.2)
Albumin: 4.9 g/dL (ref 3.5–5.5)
Alkaline Phosphatase: 91 IU/L (ref 39–117)
BUN/Creatinine Ratio: 22 (ref 9–23)
BUN: 20 mg/dL (ref 6–24)
Bilirubin Total: 0.3 mg/dL (ref 0.0–1.2)
CO2: 28 mmol/L (ref 20–29)
Calcium: 9.9 mg/dL (ref 8.7–10.2)
Chloride: 99 mmol/L (ref 96–106)
Creatinine, Ser: 0.92 mg/dL (ref 0.57–1.00)
GFR calc Af Amer: 79 mL/min/{1.73_m2} (ref >59)
GFR calc non Af Amer: 69 mL/min/{1.73_m2} (ref >59)
Globulin, Total: 2.7 g/dL (ref 1.5–4.5)
Glucose: 82 mg/dL (ref 65–99)
Potassium: 3.9 mmol/L (ref 3.5–5.2)
Sodium: 144 mmol/L (ref 134–144)
Total Protein: 7.6 g/dL (ref 6.0–8.5)

## 2016-12-04 LAB — LIPID PANEL
Chol/HDL Ratio: 3.2 ratio (ref 0.0–4.4)
Cholesterol, Total: 218 mg/dL — ABNORMAL HIGH (ref 100–199)
HDL: 68 mg/dL (ref >39)
LDL Calculated: 134 mg/dL — ABNORMAL HIGH (ref 0–99)
Triglycerides: 80 mg/dL (ref 0–149)
VLDL Cholesterol Cal: 16 mg/dL (ref 5–40)

## 2016-12-04 LAB — CBC WITH DIFFERENTIAL/PLATELET
Basophils Absolute: 0 10*3/uL (ref 0.0–0.2)
Basos: 1 %
EOS (ABSOLUTE): 0.1 10*3/uL (ref 0.0–0.4)
Eos: 2 %
Hematocrit: 43.6 % (ref 34.0–46.6)
Hemoglobin: 14.4 g/dL (ref 11.1–15.9)
Immature Grans (Abs): 0 10*3/uL (ref 0.0–0.1)
Immature Granulocytes: 0 %
Lymphocytes Absolute: 1.3 10*3/uL (ref 0.7–3.1)
Lymphs: 22 %
MCH: 28.5 pg (ref 26.6–33.0)
MCHC: 33 g/dL (ref 31.5–35.7)
MCV: 86 fL (ref 79–97)
Monocytes Absolute: 0.4 10*3/uL (ref 0.1–0.9)
Monocytes: 6 %
Neutrophils Absolute: 4 10*3/uL (ref 1.4–7.0)
Neutrophils: 69 %
Platelets: 262 10*3/uL (ref 150–379)
RBC: 5.05 x10E6/uL (ref 3.77–5.28)
RDW: 14.4 % (ref 12.3–15.4)
WBC: 5.8 10*3/uL (ref 3.4–10.8)

## 2016-12-04 LAB — TSH: TSH: 2.08 u[IU]/mL (ref 0.450–4.500)

## 2016-12-04 LAB — HEMOGLOBIN A1C
Est. average glucose Bld gHb Est-mCnc: 105 mg/dL
Hgb A1c MFr Bld: 5.3 % (ref 4.8–5.6)

## 2017-12-06 ENCOUNTER — Encounter: Payer: Self-pay | Admitting: Internal Medicine

## 2017-12-06 ENCOUNTER — Ambulatory Visit (INDEPENDENT_AMBULATORY_CARE_PROVIDER_SITE_OTHER): Payer: BLUE CROSS/BLUE SHIELD | Admitting: Internal Medicine

## 2017-12-06 VITALS — BP 108/62 | HR 79 | Temp 98.8°F | Resp 16 | Ht 61.0 in | Wt 165.0 lb

## 2017-12-06 DIAGNOSIS — Z0000 Encounter for general adult medical examination without abnormal findings: Secondary | ICD-10-CM | POA: Diagnosis not present

## 2017-12-06 DIAGNOSIS — R739 Hyperglycemia, unspecified: Secondary | ICD-10-CM

## 2017-12-06 DIAGNOSIS — Z124 Encounter for screening for malignant neoplasm of cervix: Secondary | ICD-10-CM | POA: Diagnosis not present

## 2017-12-06 DIAGNOSIS — K219 Gastro-esophageal reflux disease without esophagitis: Secondary | ICD-10-CM

## 2017-12-06 DIAGNOSIS — D039 Melanoma in situ, unspecified: Secondary | ICD-10-CM | POA: Diagnosis not present

## 2017-12-06 DIAGNOSIS — I10 Essential (primary) hypertension: Secondary | ICD-10-CM

## 2017-12-06 DIAGNOSIS — Z1239 Encounter for other screening for malignant neoplasm of breast: Secondary | ICD-10-CM

## 2017-12-06 LAB — POCT URINALYSIS DIPSTICK
Bilirubin, UA: NEGATIVE
Blood, UA: NEGATIVE
Glucose, UA: NEGATIVE
Ketones, UA: NEGATIVE
Leukocytes, UA: NEGATIVE
Nitrite, UA: NEGATIVE
Protein, UA: NEGATIVE
Spec Grav, UA: <=1.005 — AB (ref 1.010–1.025)
Urobilinogen, UA: 0.2 E.U./dL
pH, UA: 6.5 (ref 5.0–8.0)

## 2017-12-06 MED ORDER — HYDROCHLOROTHIAZIDE 25 MG PO TABS: 25.0000 mg | ORAL_TABLET | Freq: Every day | ORAL | 3 refills | Status: DC

## 2017-12-06 NOTE — Progress Notes (Signed)
Date:  12/06/2017   Name:  Debra Villa   DOB:  03-Oct-1960   MRN:  [id number]   Chief Complaint: Annual Exam Debra Villa is a 57 y.o. female who presents today for her Complete Annual Exam. She feels well. She reports exercising very little. She reports she is sleeping fairly well now that she started melatonin. Last Pap in 2016 was normal.  Mammogram is due this month. Colonoscopy was done in 2017. She denies breast problems.  Hypertension  This is a chronic problem. The problem is unchanged. The problem is controlled. Pertinent negatives include no chest pain, headaches, palpitations or shortness of breath. There are no associated agents to hypertension. Past treatments include diuretics. The current treatment provides significant improvement.  Gastroesophageal Reflux  She reports no abdominal pain, no chest pain, no coughing, no heartburn or no wheezing. This is a recurrent problem. The problem occurs rarely. Pertinent negatives include no fatigue. She has tried a histamine-2 antagonist for the symptoms. The treatment provided significant relief.  Melanoma - followed regularly by Dermatology.   Review of Systems  Constitutional: Negative for chills, fatigue and fever.  HENT: Negative for congestion, hearing loss, tinnitus, trouble swallowing and voice change.   Eyes: Negative for visual disturbance.  Respiratory: Negative for cough, chest tightness, shortness of breath and wheezing.   Cardiovascular: Negative for chest pain, palpitations and leg swelling.  Gastrointestinal: Negative for abdominal pain, constipation, diarrhea, heartburn and vomiting.  Endocrine: Negative for polydipsia and polyuria.  Genitourinary: Negative for dysuria, frequency, genital sores, menstrual problem, vaginal bleeding and vaginal discharge.  Musculoskeletal: Negative for arthralgias, gait problem and joint swelling.  Skin: Negative for color change and rash.  Allergic/Immunologic: Negative  for environmental allergies.  Neurological: Negative for dizziness, tremors, light-headedness and headaches.  Hematological: Negative for adenopathy. Does not bruise/bleed easily.  Psychiatric/Behavioral: Positive for sleep disturbance (using melatonin). Negative for dysphoric mood. The patient is not nervous/anxious.     Patient Active Problem List   Diagnosis Date Noted  . Melanoma in situ ([place]) 12/03/2016  . 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 Alopecia 05/06/2015  . Essential (primary) hypertension 05/06/2015  . Gastro-esophageal reflux disease without esophagitis 05/06/2015  . Blood glucose elevated 05/06/2015    Prior to Admission medications   Medication Sig Start Date End Date Taking? Authorizing Provider  Biotin 5 MG CAPS Take 2,500 capsules by mouth.    Yes [provider]  cholecalciferol (VITAMIN D) 1000 units tablet Take 2,000 Units by mouth daily.    Yes [provider]  Famotidine (PEPCID AC MAXIMUM STRENGTH) 20 MG CHEW Chew 1 tablet by mouth daily.   Yes [provider]  hydrochlorothiazide (HYDRODIURIL) 25 MG tablet Take 1 tablet (25 mg total) by mouth daily. 12/03/16  Yes Glean Hess, MD  Melatonin 5 MG TABS Take 1 tablet by mouth daily.   Yes [provider]  Multiple Vitamins-Minerals (MULTIVITAMIN WOMEN 50+) TABS Take 1 tablet by mouth daily.   Yes [provider]    No Known Allergies  Past Surgical History:  Procedure Laterality Date  . KNEE ARTHROSCOPY    . MOLE REMOVAL      Social History   Tobacco Use  . Smoking status: Never Smoker  . Smokeless tobacco: Never Used  Substance Use Topics  . Alcohol use: Yes    Alcohol/week: 1.2 oz    Types: 2 Standard drinks or equivalent per week  . Drug use: Never     Medication  list has been reviewed and updated.  Current Meds  Medication Sig  . Biotin 5 MG CAPS Take 2,500 capsules by mouth.   . cholecalciferol (VITAMIN D) 1000 units tablet Take 2,000 Units by  mouth daily.   . Famotidine (PEPCID AC MAXIMUM STRENGTH) 20 MG CHEW Chew 1 tablet by mouth daily.  . hydrochlorothiazide (HYDRODIURIL) 25 MG tablet Take 1 tablet (25 mg total) by mouth daily.  . Melatonin 5 MG TABS Take 1 tablet by mouth daily.  . Multiple Vitamins-Minerals (MULTIVITAMIN WOMEN 50+) TABS Take 1 tablet by mouth daily.    PHQ 2/9 Scores 12/06/2017 12/03/2016 11/06/2015  PHQ - 2 Score 0 0 0    Physical Exam  Constitutional: She is oriented to person, place, and time. She appears well-developed and well-nourished. No distress.  HENT:  Head: Normocephalic and atraumatic.  Right Ear: Tympanic membrane and ear canal normal.  Left Ear: Tympanic membrane and ear canal normal.  Nose: Right sinus exhibits no maxillary sinus tenderness. Left sinus exhibits no maxillary sinus tenderness.  Mouth/Throat: Uvula is midline and oropharynx is clear and moist.  Eyes: Conjunctivae and EOM are normal. Right eye exhibits no discharge. Left eye exhibits no discharge. No scleral icterus.  Neck: Normal range of motion. Carotid bruit is not present. No erythema present. No thyromegaly present.  Cardiovascular: Normal rate, regular rhythm, normal heart sounds and normal pulses.  Pulmonary/Chest: Effort normal. No respiratory distress. She has no wheezes. Right breast exhibits no mass, no nipple discharge, no skin change and no tenderness. Left breast exhibits no mass, no nipple discharge, no skin change and no tenderness.  Abdominal: Soft. Bowel sounds are normal. There is no hepatosplenomegaly. There is no tenderness. There is no CVA tenderness.  Musculoskeletal: Normal range of motion.  Lymphadenopathy:    She has no cervical adenopathy.    She has no axillary adenopathy.  Neurological: She is alert and oriented to person, place, and time. She has normal reflexes. No cranial nerve deficit or sensory deficit.  Skin: Skin is warm, dry and intact. No rash noted.  Psychiatric: She has a normal mood and  affect. Her speech is normal and behavior is normal. Thought content normal.  Nursing note and vitals reviewed.   BP 108/62   Pulse 79   Temp 98.8 F (37.1 C) (Oral)   Resp 16   Ht 5\' 1"  (1.549 m)   Wt 165 lb (74.8 kg)   SpO2 96%   BMI 31.18 kg/m   Assessment and Plan: 1. Annual physical exam Recommend resuming regular exercise - Lipid panel - Pap IG and HPV (high risk) DNA detection - POCT urinalysis dipstick  2. Breast cancer screening Schedule at [place]; Future  3. Melanoma in situ, unspecified site [facility]) Continue regular follow up with Dermatology  4. Essential (primary) hypertension controlled - CBC with Differential/Platelet - Comprehensive metabolic panel - TSH - hydrochlorothiazide (HYDRODIURIL) 25 MG tablet; Take 1 tablet (25 mg total) by mouth daily.  Dispense: 90 tablet; Refill: 3  5. Gastro-esophageal reflux disease without esophagitis Stable on H2 blocker - CBC with Differential/Platelet  6. Blood glucose elevated - Comprehensive metabolic panel - Hemoglobin A1c   Meds ordered this encounter  Medications  . hydrochlorothiazide (HYDRODIURIL) 25 MG tablet    Sig: Take 1 tablet (25 mg total) by mouth daily.    Dispense:  90 tablet    Refill:  3    Partially dictated using Editor, commissioning. Any errors are unintentional.  Halina Maidens, MD [facility]  12/06/2017

## 2017-12-07 LAB — CBC WITH DIFFERENTIAL/PLATELET
Basophils Absolute: 0 10*3/uL (ref 0.0–0.2)
Basos: 0 %
EOS (ABSOLUTE): 0.1 10*3/uL (ref 0.0–0.4)
Eos: 2 %
Hematocrit: 40.7 % (ref 34.0–46.6)
Hemoglobin: 13.7 g/dL (ref 11.1–15.9)
Immature Grans (Abs): 0 10*3/uL (ref 0.0–0.1)
Immature Granulocytes: 0 %
Lymphocytes Absolute: 1.4 10*3/uL (ref 0.7–3.1)
Lymphs: 27 %
MCH: 29.3 pg (ref 26.6–33.0)
MCHC: 33.7 g/dL (ref 31.5–35.7)
MCV: 87 fL (ref 79–97)
Monocytes Absolute: 0.4 10*3/uL (ref 0.1–0.9)
Monocytes: 8 %
Neutrophils Absolute: 3.2 10*3/uL (ref 1.4–7.0)
Neutrophils: 63 %
Platelets: 266 10*3/uL (ref 150–450)
RBC: 4.67 x10E6/uL (ref 3.77–5.28)
RDW: 14.6 % (ref 12.3–15.4)
WBC: 5.1 10*3/uL (ref 3.4–10.8)

## 2017-12-07 LAB — COMPREHENSIVE METABOLIC PANEL
ALT: 35 IU/L — ABNORMAL HIGH (ref 0–32)
AST: 18 IU/L (ref 0–40)
Albumin/Globulin Ratio: 2 (ref 1.2–2.2)
Albumin: 4.7 g/dL (ref 3.5–5.5)
Alkaline Phosphatase: 98 IU/L (ref 39–117)
BUN/Creatinine Ratio: 24 — ABNORMAL HIGH (ref 9–23)
BUN: 16 mg/dL (ref 6–24)
Bilirubin Total: 0.4 mg/dL (ref 0.0–1.2)
CO2: 27 mmol/L (ref 20–29)
Calcium: 9.6 mg/dL (ref 8.7–10.2)
Chloride: 99 mmol/L (ref 96–106)
Creatinine, Ser: 0.67 mg/dL (ref 0.57–1.00)
GFR calc Af Amer: 111 mL/min/{1.73_m2} (ref >59)
GFR calc non Af Amer: 97 mL/min/{1.73_m2} (ref >59)
Globulin, Total: 2.4 g/dL (ref 1.5–4.5)
Glucose: 84 mg/dL (ref 65–99)
Potassium: 3.3 mmol/L — ABNORMAL LOW (ref 3.5–5.2)
Sodium: 143 mmol/L (ref 134–144)
Total Protein: 7.1 g/dL (ref 6.0–8.5)

## 2017-12-07 LAB — LIPID PANEL
Chol/HDL Ratio: 3.4 ratio (ref 0.0–4.4)
Cholesterol, Total: 220 mg/dL — ABNORMAL HIGH (ref 100–199)
HDL: 64 mg/dL (ref >39)
LDL Calculated: 137 mg/dL — ABNORMAL HIGH (ref 0–99)
Triglycerides: 93 mg/dL (ref 0–149)
VLDL Cholesterol Cal: 19 mg/dL (ref 5–40)

## 2017-12-07 LAB — HEMOGLOBIN A1C
Est. average glucose Bld gHb Est-mCnc: 114 mg/dL
Hgb A1c MFr Bld: 5.6 % (ref 4.8–5.6)

## 2017-12-07 LAB — TSH: TSH: 2.08 u[IU]/mL (ref 0.450–4.500)

## 2017-12-10 LAB — PAP IG AND HPV HIGH-RISK
HPV, high-risk: NEGATIVE
PAP Smear Comment: 0

## 2018-01-03 ENCOUNTER — Ambulatory Visit
Admission: RE | Admit: 2018-01-03 | Discharge: 2018-01-03 | Disposition: A | Discharge status: Home / Self Care | Payer: BLUE CROSS/BLUE SHIELD | Source: Ambulatory Visit | Attending: Internal Medicine | Admitting: Internal Medicine

## 2018-01-03 DIAGNOSIS — Z1239 Encounter for other screening for malignant neoplasm of breast: Secondary | ICD-10-CM

## 2018-01-03 DIAGNOSIS — Z1231 Encounter for screening mammogram for malignant neoplasm of breast: Secondary | ICD-10-CM | POA: Diagnosis not present

## 2018-05-01 ENCOUNTER — Encounter: Payer: Self-pay | Admitting: Internal Medicine

## 2018-05-01 ENCOUNTER — Ambulatory Visit: Payer: BLUE CROSS/BLUE SHIELD | Admitting: Internal Medicine

## 2018-05-01 VITALS — BP 132/84 | HR 94 | Resp 16 | Ht 61.0 in | Wt 169.4 lb

## 2018-05-01 DIAGNOSIS — F418 Other specified anxiety disorders: Secondary | ICD-10-CM

## 2018-05-01 MED ORDER — DIAZEPAM 5 MG PO TABS: 2.5000 mg | ORAL_TABLET | Freq: Three times a day (TID) | ORAL | 0 refills | Status: DC | PRN

## 2018-05-01 NOTE — Progress Notes (Signed)
    Date:  05/01/2018   Name:  Debra Villa   DOB:  Oct 02, 1960   MRN:  [id number]   Chief Complaint: Anxiety (Needs RX for plane ride- Panic attacks on plane if no nerve meds   )  Air travel anxiety - she has flown in the past but the last few trips she has had more and more anxiety.  She took something last trip and did well.  She thinks it was low dose valium. Her sx are heart racing, sweating and shaking.   Review of Systems  Constitutional: Negative for chills and fatigue.  Respiratory: Negative for cough, chest tightness, shortness of breath and wheezing.   Cardiovascular: Negative for chest pain and palpitations.  Neurological: Negative for dizziness and headaches.    Patient Active Problem List   Diagnosis Date Noted  . Melanoma in situ ([place]) 12/03/2016  . 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 Alopecia 05/06/2015  . Essential (primary) hypertension 05/06/2015  . Gastro-esophageal reflux disease without esophagitis 05/06/2015  . Blood glucose elevated 05/06/2015    No Known Allergies  Past Surgical History:  Procedure Laterality Date  . KNEE ARTHROSCOPY    . MOLE REMOVAL      Social History   Tobacco Use  . Smoking status: Never Smoker  . Smokeless tobacco: Never Used  Substance Use Topics  . Alcohol use: Yes    Alcohol/week: 2.0 standard drinks    Types: 2 Standard drinks or equivalent per week  . Drug use: Never     Medication list has been reviewed and updated.  Current Meds  Medication Sig  . Biotin 5 MG CAPS Take 2,500 capsules by mouth.   . cholecalciferol (VITAMIN D) 1000 units tablet Take 2,000 Units by mouth daily.   . Famotidine (PEPCID AC MAXIMUM STRENGTH) 20 MG CHEW Chew 1 tablet by mouth daily.  . hydrochlorothiazide (HYDRODIURIL) 25 MG tablet Take 1 tablet (25 mg total) by mouth daily.  . Melatonin 5 MG TABS Take 1 tablet by mouth daily.  . Multiple Vitamins-Minerals (MULTIVITAMIN WOMEN 50+) TABS Take 1 tablet by mouth daily.    PHQ 2/9  Scores 12/06/2017 12/03/2016 11/06/2015  PHQ - 2 Score 0 0 0    Physical Exam  Constitutional: She is oriented to person, place, and time. She appears well-developed. No distress.  HENT:  Head: Normocephalic and atraumatic.  Neck: Normal range of motion. Neck supple.  Cardiovascular: Normal rate, regular rhythm and normal heart sounds.  Pulmonary/Chest: Effort normal and breath sounds normal. No respiratory distress.  Musculoskeletal: Normal range of motion.  Neurological: She is alert and oriented to person, place, and time.  Skin: Skin is warm and dry. No rash noted.  Psychiatric: She has a normal mood and affect. Her behavior is normal. Thought content normal.  Nursing note and vitals reviewed.   BP 132/84   Pulse 94   Resp 16   Ht 5\' 1"  (1.549 m)   Wt 169 lb 6.4 oz (76.8 kg)   SpO2 98%   BMI 32.01 kg/m   Assessment and Plan: 1. Situational anxiety Use Valium 2.5 mg q 8 hours PRN   Partially dictated using Editor, commissioning. Any errors are unintentional.  Halina Maidens, MD [facility]  05/01/2018

## 2018-11-24 ENCOUNTER — Other Ambulatory Visit: Payer: Self-pay | Admitting: Internal Medicine

## 2018-11-24 DIAGNOSIS — I10 Essential (primary) hypertension: Secondary | ICD-10-CM

## 2018-12-08 ENCOUNTER — Encounter: Payer: Self-pay | Admitting: Internal Medicine

## 2018-12-08 ENCOUNTER — Other Ambulatory Visit: Payer: Self-pay

## 2018-12-08 ENCOUNTER — Ambulatory Visit (INDEPENDENT_AMBULATORY_CARE_PROVIDER_SITE_OTHER): Payer: BC Managed Care – PPO | Admitting: Internal Medicine

## 2018-12-08 VITALS — BP 148/80 | HR 88 | Resp 16 | Ht 61.0 in | Wt 157.6 lb

## 2018-12-08 DIAGNOSIS — F4321 Adjustment disorder with depressed mood: Secondary | ICD-10-CM | POA: Diagnosis not present

## 2018-12-08 MED ORDER — CLONAZEPAM 0.5 MG PO TABS: 0.5000 mg | ORAL_TABLET | Freq: Two times a day (BID) | ORAL | 0 refills | Status: DC | PRN

## 2018-12-08 NOTE — Progress Notes (Signed)
Date:  12/08/2018   Name:  Debra Villa   DOB:  08-17-1960   MRN:  [id number]   Chief Complaint: Depression (Patient lost husband recently and is suffering from severe emotional grief as it was sudden. )  Severe Grief reaction - her husband passed away a month ago after a sudden illness that was caused by pancreatic cancer.  She was able to be with him at the hospital and bring him home on Hospice.  She returned to work 2 weeks ago but feels like she is having too much anxiety.  This is especially severe at home alone in the evenings.  Any small thing brings her to tears with agitation and shaking.  She is sleeping fair, wakes at least once to let the dog out.  She is able to go back to sleep most nights. She had lost some weight but is eating several meals a day now.  She does not think that she is still losing weight. She has supportive friends and church members.  Work is understanding of her situation.  She would like to be calmer and feel better able to handle the day to day activities that are currently overwhelming her.  HPI  Review of Systems  Constitutional: Positive for appetite change and unexpected weight change (lost about 10 lbs during the past 2 months). Negative for diaphoresis.  Respiratory: Negative for cough, chest tightness and shortness of breath.   Cardiovascular: Negative for chest pain, palpitations and leg swelling.  Gastrointestinal: Negative for abdominal pain, nausea and vomiting.  Neurological: Negative for dizziness and headaches.  Psychiatric/Behavioral: Positive for agitation and sleep disturbance. Negative for confusion, hallucinations and suicidal ideas. The patient is nervous/anxious.     Patient Active Problem List   Diagnosis Date Noted  . Melanoma in situ ([place]) 12/03/2016  . 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 Alopecia 05/06/2015  . Essential (primary) hypertension 05/06/2015  . Gastro-esophageal reflux disease without esophagitis 05/06/2015  . Blood  glucose elevated 05/06/2015    No Known Allergies  Past Surgical History:  Procedure Laterality Date  . KNEE ARTHROSCOPY    . MOLE REMOVAL      Social History   Tobacco Use  . Smoking status: Never Smoker  . Smokeless tobacco: Never Used  Substance Use Topics  . Alcohol use: Yes    Alcohol/week: 2.0 standard drinks    Types: 2 Standard drinks or equivalent per week  . Drug use: Never     Medication list has been reviewed and updated.  Current Meds  Medication Sig  . Biotin 5 MG CAPS Take 2,500 capsules by mouth.   . cholecalciferol (VITAMIN D) 1000 units tablet Take 2,000 Units by mouth daily.   . diazepam (VALIUM) 5 MG tablet Take 0.5 tablets (2.5 mg total) by mouth every 8 (eight) hours as needed for anxiety.  . Famotidine (PEPCID AC MAXIMUM STRENGTH) 20 MG CHEW Chew 1 tablet by mouth daily.  . hydrochlorothiazide (HYDRODIURIL) 25 MG tablet TAKE ONE TABLET BY MOUTH EVERY DAY  . Melatonin 5 MG TABS Take 1 tablet by mouth daily.  . Multiple Vitamins-Minerals (MULTIVITAMIN WOMEN 50+) TABS Take 1 tablet by mouth daily.    PHQ 2/9 Scores 12/08/2018 12/06/2017 12/03/2016 11/06/2015  PHQ - 2 Score 6 0 0 0  PHQ- 9 Score 11 - - -    BP Readings from Last 3 Encounters:  12/08/18 (!) 148/80  05/01/18 132/84  12/06/17 108/62    Physical Exam Constitutional:  General: She is in acute distress.  Neck:     Musculoskeletal: Normal range of motion.  Cardiovascular:     Rate and Rhythm: Normal rate and regular rhythm.     Pulses: Normal pulses.     Heart sounds: No murmur.  Pulmonary:     Effort: Pulmonary effort is normal.     Breath sounds: No wheezing or rhonchi.  Lymphadenopathy:     Cervical: No cervical adenopathy.  Neurological:     Mental Status: She is alert.  Psychiatric:        Attention and Perception: Attention normal.        Mood and Affect: Affect is tearful.        Speech: Speech normal.        Thought Content: Thought content does not include  suicidal ideation. Thought content does not include suicidal plan.        Cognition and Memory: Cognition normal.        Judgment: Judgment normal.     Wt Readings from Last 3 Encounters:  12/08/18 157 lb 9.6 oz (71.5 kg)  05/01/18 169 lb 6.4 oz (76.8 kg)  12/06/17 165 lb (74.8 kg)    BP (!) 148/80   Pulse 88   Resp 16   Ht 5\' 1"  (1.549 m)   Wt 157 lb 9.6 oz (71.5 kg)   SpO2 98%   BMI 29.78 kg/m   Assessment and Plan: 1. Grief reaction Appropriately grieving at this time but will significant agitation/anxiety Will start clonazepam 0.5 mg 1/2 to 1 bid or tid as needed Cautioned about driving, operating heavy machinery etc. Follow up if sx worsen - clonazePAM (KLONOPIN) 0.5 MG tablet; Take 1 tablet (0.5 mg total) by mouth 2 (two) times daily as needed for anxiety.  Dispense: 60 tablet; Refill: 0   Partially dictated using Editor, commissioning. Any errors are unintentional.  Halina Maidens, MD [facility]  12/08/2018

## 2018-12-15 ENCOUNTER — Telehealth: Payer: Self-pay

## 2018-12-15 NOTE — Telephone Encounter (Signed)
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 If they do not want to wear the mask, they have to bring a doctors note to work saying why she cannot wear one or that it is not medically necessary.  Called pt back but left VM informing patient we cannot give anyone a letter for a mask. It has not been mandated by the state to wears masks due to Covid and if she has any issues with this she would need to discuss with her job. Otherwise, we are unable to give her a letter stating that she cannot wear a mask at work.  Told her to callback if she has any further questions.

## 2019-06-27 ENCOUNTER — Telehealth: Payer: Self-pay

## 2019-06-27 ENCOUNTER — Other Ambulatory Visit: Payer: Self-pay | Admitting: Internal Medicine

## 2019-06-27 DIAGNOSIS — F418 Other specified anxiety disorders: Secondary | ICD-10-CM

## 2019-06-27 DIAGNOSIS — F4321 Adjustment disorder with depressed mood: Secondary | ICD-10-CM

## 2019-06-27 MED ORDER — CLONAZEPAM 0.5 MG PO TABS: 0.5000 mg | ORAL_TABLET | Freq: Two times a day (BID) | ORAL | 0 refills | Status: DC | PRN

## 2019-06-27 NOTE — Telephone Encounter (Signed)
Patient informed. 

## 2019-06-27 NOTE — Telephone Encounter (Signed)
Done

## 2019-06-27 NOTE — Telephone Encounter (Signed)
Pt calling to request a RF on Clonazepam. She said she still has a few pills left but only uses this as needed.  Please advise.

## 2019-09-25 DIAGNOSIS — S66011A Strain of long flexor muscle, fascia and tendon of right thumb at wrist and hand level, initial encounter: Secondary | ICD-10-CM | POA: Diagnosis not present

## 2019-10-02 ENCOUNTER — Telehealth: Payer: Self-pay

## 2019-10-02 NOTE — Telephone Encounter (Signed)
Called pt and let her know she is due for her next cologuard. She said the last cologuard cost her $600 and she may not want to do another. Gave her the CPT for FIT test and told her to let me know if this is covered so we can do this instead.  CM

## 2019-10-24 DIAGNOSIS — S66011A Strain of long flexor muscle, fascia and tendon of right thumb at wrist and hand level, initial encounter: Secondary | ICD-10-CM | POA: Diagnosis not present

## 2019-11-09 ENCOUNTER — Other Ambulatory Visit: Payer: Self-pay | Admitting: Internal Medicine

## 2019-11-09 DIAGNOSIS — Z1231 Encounter for screening mammogram for malignant neoplasm of breast: Secondary | ICD-10-CM

## 2019-12-11 ENCOUNTER — Other Ambulatory Visit: Payer: Self-pay

## 2019-12-11 ENCOUNTER — Ambulatory Visit
Admission: RE | Admit: 2019-12-11 | Discharge: 2019-12-11 | Disposition: A | Discharge status: Home / Self Care | Payer: BC Managed Care – PPO | Source: Ambulatory Visit | Attending: Internal Medicine | Admitting: Internal Medicine

## 2019-12-11 DIAGNOSIS — Z1231 Encounter for screening mammogram for malignant neoplasm of breast: Secondary | ICD-10-CM | POA: Diagnosis not present

## 2019-12-12 ENCOUNTER — Encounter: Payer: BC Managed Care – PPO | Admitting: Internal Medicine

## 2019-12-26 ENCOUNTER — Other Ambulatory Visit: Payer: Self-pay

## 2019-12-26 ENCOUNTER — Ambulatory Visit (INDEPENDENT_AMBULATORY_CARE_PROVIDER_SITE_OTHER): Payer: BC Managed Care – PPO | Admitting: Internal Medicine

## 2019-12-26 ENCOUNTER — Encounter: Payer: Self-pay | Admitting: Internal Medicine

## 2019-12-26 VITALS — BP 118/78 | HR 80 | Temp 98.4°F | Ht 61.0 in | Wt 160.0 lb

## 2019-12-26 DIAGNOSIS — Z0000 Encounter for general adult medical examination without abnormal findings: Secondary | ICD-10-CM

## 2019-12-26 DIAGNOSIS — I10 Essential (primary) hypertension: Secondary | ICD-10-CM

## 2019-12-26 DIAGNOSIS — K219 Gastro-esophageal reflux disease without esophagitis: Secondary | ICD-10-CM

## 2019-12-26 DIAGNOSIS — Z1211 Encounter for screening for malignant neoplasm of colon: Secondary | ICD-10-CM | POA: Diagnosis not present

## 2019-12-26 DIAGNOSIS — F418 Other specified anxiety disorders: Secondary | ICD-10-CM | POA: Diagnosis not present

## 2019-12-26 DIAGNOSIS — Z23 Encounter for immunization: Secondary | ICD-10-CM

## 2019-12-26 DIAGNOSIS — D0372 Melanoma in situ of left lower limb, including hip: Secondary | ICD-10-CM

## 2019-12-26 DIAGNOSIS — R739 Hyperglycemia, unspecified: Secondary | ICD-10-CM

## 2019-12-26 LAB — POCT URINALYSIS DIPSTICK
Bilirubin, UA: NEGATIVE
Blood, UA: NEGATIVE
Glucose, UA: NEGATIVE
Ketones, UA: NEGATIVE
Leukocytes, UA: NEGATIVE
Nitrite, UA: NEGATIVE
Protein, UA: NEGATIVE
Spec Grav, UA: <=1.005 — AB (ref 1.010–1.025)
Urobilinogen, UA: 0.2 E.U./dL
pH, UA: 5 (ref 5.0–8.0)

## 2019-12-26 MED ORDER — HYDROCHLOROTHIAZIDE 25 MG PO TABS: 25.0000 mg | ORAL_TABLET | Freq: Every day | ORAL | 3 refills | Status: DC

## 2019-12-26 MED ORDER — CLONAZEPAM 0.5 MG PO TABS: 0.5000 mg | ORAL_TABLET | Freq: Two times a day (BID) | ORAL | 0 refills | Status: DC | PRN

## 2019-12-26 NOTE — Progress Notes (Signed)
Date:  12/26/2019   Name:  Debra Villa   DOB:  03-03-1961   MRN:  [id number]   Chief Complaint: Annual Exam (no breast exam mam done 11/2019 no pap) and Colon Cancer Screening (cologuard )  Debra Villa is a 59 y.o. female who presents today for her Complete Annual Exam. She feels well. She reports exercising working in the yard/ stair stepper every now and then. She reports she is sleeping fairly well. Breast complaints none. Mammogram was just completed. She is still adjusting to living alone - the evening after work is the most difficult for her.  She is using clonazepam as needed - #60 has lasted for more than 6 months.  Mammogram:  11/2019 Pap smear: 11/2017 neg with co-testing Colonoscopy:  Cologuard due  Immunization History  Administered Date(s) Administered  . Influenza-Unspecified 07/23/2016  . Moderna SARS-COVID-2 Vaccination 09/07/2019, 10/05/2019  . Zoster 05/18/2011    Hypertension This is a chronic problem. The problem is controlled. Associated symptoms include anxiety. Pertinent negatives include no chest pain, headaches, palpitations or shortness of breath. Past treatments include diuretics. The current treatment provides significant improvement.  Gastroesophageal Reflux She complains of heartburn. She reports no abdominal pain, no chest pain, no coughing, no dysphagia, no water brash or no wheezing. This is a recurrent problem. The problem occurs rarely. Pertinent negatives include no fatigue. She has tried a histamine-2 antagonist for the symptoms. The treatment provided significant relief.  Anxiety Presents for follow-up visit. Symptoms include nervous/anxious behavior and restlessness. Patient reports no chest pain, dizziness, palpitations or shortness of breath. Symptoms occur most days. The severity of symptoms is moderate. Nighttime awakenings: one to two.      Lab Results  Component Value Date   CREATININE 0.67 12/06/2017   BUN 16 12/06/2017   NA  143 12/06/2017   K 3.3 (L) 12/06/2017   CL 99 12/06/2017   CO2 27 12/06/2017   Lab Results  Component Value Date   CHOL 220 (H) 12/06/2017   HDL 64 12/06/2017   LDLCALC 137 (H) 12/06/2017   TRIG 93 12/06/2017   CHOLHDL 3.4 12/06/2017   Lab Results  Component Value Date   TSH 2.080 12/06/2017   Lab Results  Component Value Date   HGBA1C 5.6 12/06/2017   Lab Results  Component Value Date   WBC 5.1 12/06/2017   HGB 13.7 12/06/2017   HCT 40.7 12/06/2017   MCV 87 12/06/2017   PLT 266 12/06/2017   Lab Results  Component Value Date   ALT 35 (H) 12/06/2017   AST 18 12/06/2017   ALKPHOS 98 12/06/2017   BILITOT 0.4 12/06/2017     Review of Systems  Constitutional: Negative for chills, fatigue and fever.  HENT: Negative for congestion, hearing loss, tinnitus, trouble swallowing and voice change.   Eyes: Negative for visual disturbance.  Respiratory: Negative for cough, chest tightness, shortness of breath and wheezing.   Cardiovascular: Negative for chest pain, palpitations and leg swelling.  Gastrointestinal: Positive for heartburn. Negative for abdominal pain, constipation, diarrhea, dysphagia and vomiting.  Endocrine: Negative for polydipsia and polyuria.  Genitourinary: Negative for dysuria, frequency, genital sores, vaginal bleeding and vaginal discharge.  Musculoskeletal: Negative for arthralgias, gait problem and joint swelling.  Skin: Negative for color change and rash.  Neurological: Negative for dizziness, tremors, light-headedness and headaches.  Hematological: Negative for adenopathy. Does not bruise/bleed easily.  Psychiatric/Behavioral: Negative for dysphoric mood and sleep disturbance. The patient is nervous/anxious.  Patient Active Problem List   Diagnosis Date Noted  . Melanoma in situ ([place]) 12/03/2016  . 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 Alopecia 05/06/2015  . Essential (primary) hypertension 05/06/2015  . Gastro-esophageal reflux disease without  esophagitis 05/06/2015  . Blood glucose elevated 05/06/2015    Allergies  Allergen Reactions  . Wasp Venom Protein Swelling    Past Surgical History:  Procedure Laterality Date  . KNEE ARTHROSCOPY    . MOLE REMOVAL      Social History   Tobacco Use  . Smoking status: Never Smoker  . Smokeless tobacco: Never Used  Vaping Use  . Vaping Use: Never used  Substance Use Topics  . Alcohol use: Yes    Alcohol/week: 2.0 standard drinks    Types: 2 Standard drinks or equivalent per week  . Drug use: Never     Medication list has been reviewed and updated.  Current Meds  Medication Sig  . Biotin 5 MG CAPS Take 2,500 capsules by mouth.   . cholecalciferol (VITAMIN D) 1000 units tablet Take 2,000 Units by mouth daily.   . clonazePAM (KLONOPIN) 0.5 MG tablet Take 1 tablet (0.5 mg total) by mouth 2 (two) times daily as needed for anxiety.  . Famotidine (PEPCID AC MAXIMUM STRENGTH) 20 MG CHEW Chew 1 tablet by mouth daily.  . hydrochlorothiazide (HYDRODIURIL) 25 MG tablet TAKE ONE TABLET BY MOUTH EVERY DAY  . Melatonin 5 MG TABS Take 1 tablet by mouth as needed.   . Multiple Vitamins-Minerals (MULTIVITAMIN WOMEN 50+) TABS Take 1 tablet by mouth daily.    PHQ 2/9 Scores 12/26/2019 12/08/2018 12/06/2017 12/03/2016  PHQ - 2 Score 1 6 0 0  PHQ- 9 Score 2 11 - -    GAD 7 : Generalized Anxiety Score 12/26/2019  Nervous, Anxious, on Edge 0  Control/stop worrying 0  Worry too much - different things 0  Trouble relaxing 1  Restless 0  Easily annoyed or irritable 0  Afraid - awful might happen 0  Total GAD 7 Score 1  Anxiety Difficulty Not difficult at all    BP Readings from Last 3 Encounters:  12/26/19 118/78  12/08/18 (!) 148/80  05/01/18 132/84    Physical Exam Vitals and nursing note reviewed.  Constitutional:      General: She is not in acute distress.    Appearance: She is well-developed.  HENT:     Head: Normocephalic and atraumatic.     Right Ear: Tympanic membrane and  ear canal normal.     Left Ear: Tympanic membrane and ear canal normal.     Nose:     Right Sinus: No maxillary sinus tenderness.     Left Sinus: No maxillary sinus tenderness.  Eyes:     General: No scleral icterus.       Right eye: No discharge.        Left eye: No discharge.     Conjunctiva/sclera: Conjunctivae normal.  Neck:     Thyroid: No thyromegaly.     Vascular: No carotid bruit.  Cardiovascular:     Rate and Rhythm: Normal rate and regular rhythm.     Pulses: Normal pulses.     Heart sounds: Normal heart sounds.  Pulmonary:     Effort: Pulmonary effort is normal. No respiratory distress.     Breath sounds: No wheezing.  Abdominal:     General: Bowel sounds are normal.     Palpations: Abdomen is soft.     Tenderness: There is no abdominal  tenderness.  Musculoskeletal:     Cervical back: Normal range of motion. No erythema.     Right lower leg: No edema.     Left lower leg: No edema.  Lymphadenopathy:     Cervical: No cervical adenopathy.  Skin:    General: Skin is warm and dry.     Capillary Refill: Capillary refill takes less than 2 seconds.     Findings: No rash.  Neurological:     General: No focal deficit present.     Mental Status: She is alert and oriented to person, place, and time.     Cranial Nerves: No cranial nerve deficit.     Sensory: No sensory deficit.     Deep Tendon Reflexes: Reflexes are normal and symmetric.  Psychiatric:        Attention and Perception: Attention normal.        Mood and Affect: Mood normal.     Wt Readings from Last 3 Encounters:  12/26/19 160 lb (72.6 kg)  12/08/18 157 lb 9.6 oz (71.5 kg)  05/01/18 169 lb 6.4 oz (76.8 kg)    BP 118/78   Pulse 80   Temp 98.4 F (36.9 C) (Oral)   Ht 5\' 1"  (1.549 m)   Wt 160 lb (72.6 kg)   SpO2 97%   BMI 30.23 kg/m   Assessment and Plan: 1. Annual physical exam Normal exam Continue healthy diet, exercise - Lipid panel - POCT urinalysis dipstick  2. Colon cancer screening  - Cologuard  3. Essential (primary) hypertension Clinically stable exam with well controlled BP on hctz. Tolerating medications without side effects at this time. Pt to continue current regimen and low sodium diet; benefits of regular exercise as able discussed. - CBC with Differential/Platelet - Comprehensive metabolic panel - TSH - hydrochlorothiazide (HYDRODIURIL) 25 MG tablet; Take 1 tablet (25 mg total) by mouth daily.  Dispense: 90 tablet; Refill: 3  4. Gastro-esophageal reflux disease without esophagitis Symptoms well controlled No red flag signs such as weight loss, n/v, melena Will continue famotidine PRN.  - CBC with Differential/Platelet  5. Blood glucose elevated Check labs  6. Melanoma in situ of left lower extremity including hip ([place]) Followed by Dermatology  7. Situational anxiety Continue PRN clonazepam - clonazePAM (KLONOPIN) 0.5 MG tablet; Take 1 tablet (0.5 mg total) by mouth 2 (two) times daily as needed for anxiety.  Dispense: 60 tablet; Refill: 0  8. Need for shingles vaccine First dose today - Varicella-zoster vaccine IM   Partially dictated using Editor, commissioning. Any errors are unintentional.  Halina Maidens, MD [facility]  12/26/2019

## 2019-12-27 LAB — COMPREHENSIVE METABOLIC PANEL
ALT: 19 IU/L (ref 0–32)
AST: 15 IU/L (ref 0–40)
Albumin/Globulin Ratio: 1.5 (ref 1.2–2.2)
Albumin: 4.3 g/dL (ref 3.8–4.8)
Alkaline Phosphatase: 91 IU/L (ref 48–121)
BUN/Creatinine Ratio: 21 (ref 12–28)
BUN: 14 mg/dL (ref 8–27)
Bilirubin Total: 0.4 mg/dL (ref 0.0–1.2)
CO2: 28 mmol/L (ref 20–29)
Calcium: 9.9 mg/dL (ref 8.7–10.3)
Chloride: 99 mmol/L (ref 96–106)
Creatinine, Ser: 0.66 mg/dL (ref 0.57–1.00)
GFR calc Af Amer: 110 mL/min/{1.73_m2} (ref >59)
GFR calc non Af Amer: 96 mL/min/{1.73_m2} (ref >59)
Globulin, Total: 2.8 g/dL (ref 1.5–4.5)
Glucose: 84 mg/dL (ref 65–99)
Potassium: 4 mmol/L (ref 3.5–5.2)
Sodium: 140 mmol/L (ref 134–144)
Total Protein: 7.1 g/dL (ref 6.0–8.5)

## 2019-12-27 LAB — TSH: TSH: 1.95 u[IU]/mL (ref 0.450–4.500)

## 2019-12-27 LAB — CBC WITH DIFFERENTIAL/PLATELET
Basophils Absolute: 0 10*3/uL (ref 0.0–0.2)
Basos: 1 %
EOS (ABSOLUTE): 0.3 10*3/uL (ref 0.0–0.4)
Eos: 5 %
Hematocrit: 42 % (ref 34.0–46.6)
Hemoglobin: 14.1 g/dL (ref 11.1–15.9)
Immature Grans (Abs): 0 10*3/uL (ref 0.0–0.1)
Immature Granulocytes: 0 %
Lymphocytes Absolute: 1.6 10*3/uL (ref 0.7–3.1)
Lymphs: 30 %
MCH: 30.1 pg (ref 26.6–33.0)
MCHC: 33.6 g/dL (ref 31.5–35.7)
MCV: 90 fL (ref 79–97)
Monocytes Absolute: 0.4 10*3/uL (ref 0.1–0.9)
Monocytes: 8 %
Neutrophils Absolute: 3 10*3/uL (ref 1.4–7.0)
Neutrophils: 56 %
Platelets: 223 10*3/uL (ref 150–450)
RBC: 4.69 x10E6/uL (ref 3.77–5.28)
RDW: 13.2 % (ref 11.7–15.4)
WBC: 5.2 10*3/uL (ref 3.4–10.8)

## 2019-12-27 LAB — LIPID PANEL
Chol/HDL Ratio: 3.3 ratio (ref 0.0–4.4)
Cholesterol, Total: 210 mg/dL — ABNORMAL HIGH (ref 100–199)
HDL: 63 mg/dL (ref >39)
LDL Chol Calc (NIH): 133 mg/dL — ABNORMAL HIGH (ref 0–99)
Triglycerides: 76 mg/dL (ref 0–149)
VLDL Cholesterol Cal: 14 mg/dL (ref 5–40)

## 2019-12-28 ENCOUNTER — Telehealth: Payer: Self-pay | Admitting: Internal Medicine

## 2019-12-28 NOTE — Telephone Encounter (Signed)
Pt is not having any symptoms at this time she is feeling better she still has the red patch but no fever , no headache and it is not painful. Told pt if she starts to have any symptoms that are bad this weekend to go to [facility] or the ER. Or call us Monday to be seen. Pt verbalized understanding.  KP

## 2019-12-28 NOTE — Telephone Encounter (Signed)
Copied from [place] [phone]. Topic: General - Other >> Dec 28, 2019 10:03 AM Celene Kras wrote: Reason for CRM: Pt called and is requesting to speak with nurse. Pt states that she had the shingles vaccine on 12/26/19. Pt states that she now has a raised red patch on her arm and is around someone undergoing chemo and is concerned. Please advise.

## 2020-03-13 LAB — COLOGUARD: Cologuard: NEGATIVE

## 2020-03-16 LAB — COLOGUARD: COLOGUARD: NEGATIVE

## 2020-03-16 LAB — EXTERNAL GENERIC LAB PROCEDURE: COLOGUARD: NEGATIVE

## 2020-03-17 ENCOUNTER — Telehealth: Payer: Self-pay | Admitting: Internal Medicine

## 2020-03-17 NOTE — Telephone Encounter (Signed)
Cologuard is negative.  Repeat in 3 yrs for screening purposes. 

## 2020-03-18 NOTE — Telephone Encounter (Signed)
Called pt left VM that cologuard is negative. Will repeat in 3 years.  KP

## 2020-03-27 ENCOUNTER — Other Ambulatory Visit: Payer: Self-pay

## 2020-03-27 ENCOUNTER — Ambulatory Visit (INDEPENDENT_AMBULATORY_CARE_PROVIDER_SITE_OTHER): Payer: BC Managed Care – PPO

## 2020-03-27 DIAGNOSIS — Z23 Encounter for immunization: Secondary | ICD-10-CM

## 2020-06-18 ENCOUNTER — Ambulatory Visit (INDEPENDENT_AMBULATORY_CARE_PROVIDER_SITE_OTHER): Payer: BC Managed Care – PPO

## 2020-06-18 ENCOUNTER — Ambulatory Visit
Admission: EM | Admit: 2020-06-18 | Discharge: 2020-06-18 | Disposition: A | Discharge status: Home / Self Care | Payer: BC Managed Care – PPO | Attending: Family Medicine | Admitting: Family Medicine

## 2020-06-18 ENCOUNTER — Ambulatory Visit: Payer: Self-pay | Admitting: Hematology

## 2020-06-18 ENCOUNTER — Other Ambulatory Visit: Payer: Self-pay

## 2020-06-18 DIAGNOSIS — R0602 Shortness of breath: Secondary | ICD-10-CM | POA: Diagnosis not present

## 2020-06-18 DIAGNOSIS — M7918 Myalgia, other site: Secondary | ICD-10-CM

## 2020-06-18 MED ORDER — MELOXICAM 15 MG PO TABS
15.0000 mg | ORAL_TABLET | Freq: Every day | ORAL | 0 refills | Status: AC | PRN
Start: 2020-06-18 — End: ?

## 2020-06-18 NOTE — Discharge Instructions (Signed)
EKG normal.  Chest x-ray normal.  Exam unremarkable.  This is likely musculoskeletal.  Medication as prescribed.  Supportive care.  If persist, follow-up with your primary care physician.  Take care  Dr. Adriana Simas

## 2020-06-18 NOTE — ED Provider Notes (Addendum)
[facility]    CSN: [id number] Arrival date & time: 06/18/20  1204      History   Chief Complaint Chief Complaint  Patient presents with  . Shortness of Breath   HPI  59 year old female presents with difficulty taking a deep breath, back pain, and rib pain.  Started approximate 1 week ago.  She states that this occurs primarily at night when she lies down.  She states that she has pain in her upper back which extends around to the lower ribs.  She states that she has difficulty taking a good deep breath.  Seems to be worse with certain movements as well.  She has difficulty getting comfortable in bed.  Pain 6/10 in severity.  No relieving factors.  Denies chest pain.  She attempted to see her primary care physician and was directed to come here.  No other complaints at this time.  Past Medical History:  Diagnosis Date  . Cancer [facility])    skin ca    Patient Active Problem List   Diagnosis Date Noted  . Melanoma in situ ([facility]) 12/03/2016  . 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 Alopecia 05/06/2015  . Essential (primary) hypertension 05/06/2015  . Gastro-esophageal reflux disease without esophagitis 05/06/2015  . Blood glucose elevated 05/06/2015    Past Surgical History:  Procedure Laterality Date  . KNEE ARTHROSCOPY    . MOLE REMOVAL      OB History   No obstetric history on file.      Home Medications    Prior to Admission medications   Medication Sig Start Date End Date Taking? Authorizing Provider  Biotin 5 MG CAPS Take 2,500 capsules by mouth.    Yes [provider]  cholecalciferol (VITAMIN D) 1000 units tablet Take 2,000 Units by mouth daily.    Yes [provider]  clonazePAM (KLONOPIN) 0.5 MG tablet Take 1 tablet (0.5 mg total) by mouth 2 (two) times daily as needed for anxiety. 12/26/19  Yes Reubin Milan, MD  Famotidine 20 MG CHEW Chew 1 tablet by mouth daily.   Yes [provider]  hydrochlorothiazide (HYDRODIURIL) 25 MG  tablet Take 1 tablet (25 mg total) by mouth daily. 12/26/19  Yes Reubin Milan, MD  Melatonin 5 MG TABS Take 1 tablet by mouth as needed.    Yes [provider]  meloxicam (MOBIC) 15 MG tablet Take 1 tablet (15 mg total) by mouth daily as needed for pain. 06/18/20  Yes Catlyn Shipton, Verdis Frederickson, DO  Multiple Vitamins-Minerals (MULTIVITAMIN WOMEN 50+) TABS Take 1 tablet by mouth daily.   Yes [provider]    Family History Family History  Problem Relation Age of Onset  . Hypertension Mother   . Hypertension Father   . Heart attack Father   . Hypertension Brother   . Hypertension Brother   . Breast cancer Neg Hx     Social History Social History   Tobacco Use  . Smoking status: Never Smoker  . Smokeless tobacco: Never Used  Vaping Use  . Vaping Use: Never used  Substance Use Topics  . Alcohol use: Yes    Alcohol/week: 2.0 standard drinks    Types: 2 Standard drinks or equivalent per week  . Drug use: Never     Allergies   Wasp venom protein and Hazelnut (filbert) allergy skin test   Review of Systems Review of Systems  Respiratory: Positive for shortness of breath.   Musculoskeletal:       Thoracic back  pain, rib pain.   Physical Exam Triage Vital Signs ED Triage Vitals  Enc Vitals Group     BP 06/18/20 1406 (!) 149/79     Pulse Rate 06/18/20 1406 76     Resp 06/18/20 1406 20     Temp 06/18/20 1406 98.6 F (37 C)     Temp Source 06/18/20 1406 Oral     SpO2 06/18/20 1406 100 %     Weight --      Height --      Head Circumference --      Peak Flow --      Pain Score 06/18/20 1402 6     Pain Loc --      Pain Edu? --      Excl. in [place]? --    Updated Vital Signs BP (!) 149/79 (BP Location: Left Arm)   Pulse 76   Temp 98.6 F (37 C) (Oral)   Resp 20   SpO2 100%   Visual Acuity Right Eye Distance:   Left Eye Distance:   Bilateral Distance:    Right Eye Near:   Left Eye Near:    Bilateral Near:     Physical Exam   UC Treatments /  Results  Labs (all labs ordered are listed, but only abnormal results are displayed) Labs Reviewed - No data to display  EKG Interpretation: Normal sinus rhythm with rate of 88.  Normal axis.  Normal intervals.  No ST or T wave changes.   Radiology DG Chest 2 View  Result Date: 06/18/2020 CLINICAL DATA:  Shortness of breath EXAM: CHEST - 2 VIEW COMPARISON:  None. FINDINGS: The heart size and mediastinal contours are within normal limits. Both lungs are clear. The visualized skeletal structures are unremarkable. IMPRESSION: No active cardiopulmonary disease. Electronically Signed   By: Kathreen Devoid   On: 06/18/2020 15:14    Procedures Procedures (including critical care time)  Medications Ordered in UC Medications - No data to display  Initial Impression / Assessment and Plan / UC Course  I have reviewed the triage vital signs and the nursing notes.  Pertinent labs & imaging results that were available during my care of the patient were reviewed by me and considered in my medical decision making (see chart for details).    59 year old female presents with musculoskeletal pain.  EKG unremarkable.  Chest x-ray was obtained and was independently interpreted by me.  Interpretation: Chest x-ray normal.  Exam unremarkable.  Meloxicam as directed.  Supportive care.   Final Clinical Impressions(s) / UC Diagnoses   Final diagnoses:  Musculoskeletal pain     Discharge Instructions     EKG normal.  Chest x-ray normal.  Exam unremarkable.  This is likely musculoskeletal.  Medication as prescribed.  Supportive care.  If persist, follow-up with your primary care physician.  Take care  Dr. Lacinda Axon    ED Prescriptions    Medication Sig Dispense Auth. Provider   meloxicam (MOBIC) 15 MG tablet Take 1 tablet (15 mg total) by mouth daily as needed for pain. 30 tablet Coral Spikes, DO     PDMP not reviewed this encounter.     Coral Spikes, [place] 06/18/20 [phone]

## 2020-06-18 NOTE — ED Triage Notes (Signed)
Pt presents with feeling that she is unable to take a full breath x 1 week..  Pain across bottom of ribs and around sides with breathing.  Initially thought was muscular. Primarily at night with much improvement in morning once she is up and moving around.  When turning over at night has to maneuver multiple times.

## 2020-06-18 NOTE — Telephone Encounter (Signed)
Patient has been experiencing pain when taking a deep breath for 1 week.  It has started to wake her up at night.  Pain intensifies to a 8 when taking deep breath and turning over in bed. Patient denies any strenuous workouts, denies being positive for COVID or any covid symptoms. / Patient advised to go to UC as she may need an xray to determine what is going on (pneumonia, bronchitis, etc.). / Patient agrees to plan Reason for Disposition . Taking a deep breath makes pain worse  Answer Assessment - Initial Assessment Questions 1. LOCATION: "Where does it hurt?"      Back and through rib cage 2. RADIATION: "Does the pain go anywhere else?" (e.g., into neck, jaw, arms, back)  no 3. ONSET: "When did the chest pain begin?" (Minutes, hours or days)  A week ago 4. PATTERN "Does the pain come and go, or has it been constant since it started?"  "Does it get worse with exertion?"    Comes and go with breathing 5. DURATION: "How long does it last" (e.g., seconds, minutes, hours)     Only when taking a deep breath 6. SEVERITY: "How bad is the pain?"  (e.g., Scale 1-10; mild, moderate, or severe)    - MILD (1-3): doesn't interfere with normal activities     - MODERATE (4-7): interferes with normal activities or awakens from sleep    - SEVERE (8-10): excruciating pain, unable to do any normal activities    8 7. CARDIAC RISK FACTORS: "Do you have any history of heart problems or risk factors for heart disease?" (e.g., angina, prior heart attack; diabetes, high blood pressure, high cholesterol, smoker, or strong family history of heart disease)   no 8. PULMONARY RISK FACTORS: "Do you have any history of lung disease?"  (e.g., blood clots in lung, asthma, emphysema, birth control pills)    no 9. CAUSE: "What do you think is causing the chest pain?"     Not sure 10. OTHER SYMPTOMS: "Do you have any other symptoms?" (e.g., dizziness, nausea, vomiting, sweating, fever, difficulty breathing, cough)       Mild dizziness this morning once  Protocols used: CHEST PAIN-A-AH

## 2020-12-17 ENCOUNTER — Other Ambulatory Visit: Payer: Self-pay | Admitting: Internal Medicine

## 2020-12-17 DIAGNOSIS — Z1231 Encounter for screening mammogram for malignant neoplasm of breast: Secondary | ICD-10-CM

## 2020-12-23 ENCOUNTER — Other Ambulatory Visit: Payer: Self-pay

## 2020-12-23 ENCOUNTER — Other Ambulatory Visit: Payer: Self-pay | Admitting: Internal Medicine

## 2020-12-23 ENCOUNTER — Ambulatory Visit
Admission: RE | Admit: 2020-12-23 | Discharge: 2020-12-23 | Disposition: A | Discharge status: Home / Self Care | Payer: BC Managed Care – PPO | Source: Ambulatory Visit | Attending: Internal Medicine | Admitting: Internal Medicine

## 2020-12-23 DIAGNOSIS — Z1231 Encounter for screening mammogram for malignant neoplasm of breast: Secondary | ICD-10-CM | POA: Diagnosis not present

## 2020-12-23 DIAGNOSIS — I10 Essential (primary) hypertension: Secondary | ICD-10-CM

## 2020-12-23 NOTE — Telephone Encounter (Signed)
   Notes to clinic:  Patient has upcoming appt on 12/30/2020 Review for refill    Requested Prescriptions  Pending Prescriptions Disp Refills   hydrochlorothiazide (HYDRODIURIL) 25 MG tablet [Pharmacy Med Name: HYDROCHLOROTHIAZIDE 25 MG TAB] 90 tablet 3    Sig: TAKE 1 TABLET BY MOUTH ONCE DAILY      Cardiovascular: Diuretics - Thiazide Failed - 12/23/2020 12:30 PM      Failed - Ca in normal range and within 360 days    Calcium  Date Value Ref Range Status  12/26/2019 9.9 8.7 - 10.3 mg/dL Final          Failed - Cr in normal range and within 360 days    Creatinine, Ser  Date Value Ref Range Status  12/26/2019 0.66 0.57 - 1.00 mg/dL Final          Failed - K in normal range and within 360 days    Potassium  Date Value Ref Range Status  12/26/2019 4.0 3.5 - 5.2 mmol/L Final          Failed - Na in normal range and within 360 days    Sodium  Date Value Ref Range Status  12/26/2019 140 134 - 144 mmol/L Final          Failed - Last BP in normal range    BP Readings from Last 1 Encounters:  06/18/20 (!) 149/79          Failed - Valid encounter within last 6 months    Recent Outpatient Visits           12 months ago Annual physical exam   [facility] Glean Hess, MD   2 years ago Grief reaction   [facility] Glean Hess, MD   2 years ago Situational anxiety   [facility] Glean Hess, MD   3 years ago Annual physical exam   [facility] Glean Hess, MD   4 years ago Annual physical exam   [facility] Glean Hess, MD       Future Appointments             In 1 week Army Melia Jesse Sans, MD [facility], [facility]

## 2020-12-25 ENCOUNTER — Other Ambulatory Visit: Payer: Self-pay | Admitting: Internal Medicine

## 2020-12-25 DIAGNOSIS — N6310 Unspecified lump in the right breast, unspecified quadrant: Secondary | ICD-10-CM

## 2020-12-25 DIAGNOSIS — R928 Other abnormal and inconclusive findings on diagnostic imaging of breast: Secondary | ICD-10-CM

## 2020-12-30 ENCOUNTER — Encounter: Payer: Self-pay | Admitting: Internal Medicine

## 2020-12-30 ENCOUNTER — Other Ambulatory Visit: Payer: Self-pay

## 2020-12-30 ENCOUNTER — Ambulatory Visit (INDEPENDENT_AMBULATORY_CARE_PROVIDER_SITE_OTHER): Payer: BC Managed Care – PPO | Admitting: Internal Medicine

## 2020-12-30 VITALS — BP 130/82 | HR 95 | Temp 98.6°F | Ht 61.0 in | Wt 160.0 lb

## 2020-12-30 DIAGNOSIS — K219 Gastro-esophageal reflux disease without esophagitis: Secondary | ICD-10-CM

## 2020-12-30 DIAGNOSIS — I10 Essential (primary) hypertension: Secondary | ICD-10-CM | POA: Diagnosis not present

## 2020-12-30 DIAGNOSIS — Z0000 Encounter for general adult medical examination without abnormal findings: Secondary | ICD-10-CM | POA: Diagnosis not present

## 2020-12-30 DIAGNOSIS — D0372 Melanoma in situ of left lower limb, including hip: Secondary | ICD-10-CM

## 2020-12-30 DIAGNOSIS — R928 Other abnormal and inconclusive findings on diagnostic imaging of breast: Secondary | ICD-10-CM

## 2020-12-30 LAB — POCT URINALYSIS DIPSTICK
Bilirubin, UA: NEGATIVE
Blood, UA: NEGATIVE
Glucose, UA: NEGATIVE
Ketones, UA: NEGATIVE
Leukocytes, UA: NEGATIVE
Nitrite, UA: NEGATIVE
Protein, UA: NEGATIVE
Spec Grav, UA: 1.015 (ref 1.010–1.025)
Urobilinogen, UA: 0.2 E.U./dL
pH, UA: 5 (ref 5.0–8.0)

## 2020-12-30 NOTE — Progress Notes (Signed)
Date:  12/30/2020   Name:  Debra Villa   DOB:  May 04, 1961   MRN:  [id number]   Chief Complaint: Annual Exam (Breast exam no pap, had mammo last week, was called back to do another on, [facility] ) Debra Villa is a 60 y.o. female who presents today for her Complete Annual Exam. She feels well. She reports exercising walking X1 day a week. She reports she is sleeping well. Breast complaints none.  She continues to have anxiety episodes since her husband passed away 2 years ago.  Mammogram: 12/2020 possible right mass - Dx mammo/US this week DEXA: none Pap smear: 11/2017 Colonoscopy: cologuard 02/2020 neg  Immunization History  Administered Date(s) Administered   Influenza-Unspecified 07/23/2016   Moderna Sars-Covid-2 Vaccination 09/07/2019, 10/05/2019, 08/01/2020   Zoster Recombinat (Shingrix) 12/26/2019, 03/27/2020   Zoster, Live 05/18/2011    Hypertension This is a chronic problem. The problem is controlled. Pertinent negatives include no chest pain, headaches, palpitations or shortness of breath. Past treatments include diuretics.  Gastroesophageal Reflux She reports no abdominal pain, no chest pain, no coughing or no wheezing. Pertinent negatives include no fatigue. She has tried a histamine-2 antagonist for the symptoms.   Lab Results  Component Value Date   CREATININE 0.66 12/26/2019   BUN 14 12/26/2019   NA 140 12/26/2019   K 4.0 12/26/2019   CL 99 12/26/2019   CO2 28 12/26/2019   Lab Results  Component Value Date   CHOL 210 (H) 12/26/2019   HDL 63 12/26/2019   LDLCALC 133 (H) 12/26/2019   TRIG 76 12/26/2019   CHOLHDL 3.3 12/26/2019   Lab Results  Component Value Date   TSH 1.950 12/26/2019   Lab Results  Component Value Date   HGBA1C 5.6 12/06/2017   Lab Results  Component Value Date   WBC 5.2 12/26/2019   HGB 14.1 12/26/2019   HCT 42.0 12/26/2019   MCV 90 12/26/2019   PLT 223 12/26/2019   Lab Results  Component Value Date   ALT 19  12/26/2019   AST 15 12/26/2019   ALKPHOS 91 12/26/2019   BILITOT 0.4 12/26/2019     Review of Systems  Constitutional:  Negative for chills, fatigue and fever.  HENT:  Negative for congestion, hearing loss, tinnitus, trouble swallowing and voice change.   Eyes:  Negative for visual disturbance.  Respiratory:  Negative for cough, chest tightness, shortness of breath and wheezing.   Cardiovascular:  Negative for chest pain, palpitations and leg swelling.  Gastrointestinal:  Positive for abdominal distention. Negative for abdominal pain, constipation, diarrhea and vomiting.  Endocrine: Negative for polydipsia and polyuria.  Genitourinary:  Negative for dysuria, frequency, genital sores, vaginal bleeding and vaginal discharge.  Musculoskeletal:  Positive for arthralgias (thumb arthritis). Negative for gait problem and joint swelling.  Skin:  Negative for color change and rash.  Allergic/Immunologic: Negative for environmental allergies.  Neurological:  Negative for dizziness, tremors, light-headedness and headaches.  Hematological:  Negative for adenopathy. Does not bruise/bleed easily.  Psychiatric/Behavioral:  Negative for dysphoric mood and sleep disturbance. The patient is not nervous/anxious.    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   Alopecia 05/06/2015   Essential (primary) hypertension 05/06/2015   Gastro-esophageal reflux disease without esophagitis 05/06/2015   Blood glucose elevated 05/06/2015    Allergies  Allergen Reactions   Wasp Venom Protein Swelling   Hazelnut ([place]) Allergy Skin Test  Past Surgical History:  Procedure Laterality Date   KNEE ARTHROSCOPY     MOLE REMOVAL      Social History   Tobacco Use   Smoking status: Never   Smokeless tobacco: Never  Vaping Use   Vaping Use: Never used  Substance Use Topics   Alcohol use: Yes    Alcohol/week: 2.0 standard drinks     Types: 2 Standard drinks or equivalent per week   Drug use: Never     Medication list has been reviewed and updated.  Current Meds  Medication Sig   Biotin 5 MG CAPS Take 2,500 capsules by mouth.    cholecalciferol (VITAMIN D) 1000 units tablet Take 2,000 Units by mouth daily.    clonazePAM (KLONOPIN) 0.5 MG tablet Take 1 tablet (0.5 mg total) by mouth 2 (two) times daily as needed for anxiety.   Famotidine 20 MG CHEW Chew 1 tablet by mouth daily.   hydrochlorothiazide (HYDRODIURIL) 25 MG tablet TAKE 1 TABLET BY MOUTH ONCE DAILY   Melatonin 5 MG TABS Take 1 tablet by mouth as needed.    Multiple Vitamins-Minerals (MULTIVITAMIN WOMEN 50+) TABS Take 1 tablet by mouth daily.    PHQ 2/9 Scores 12/30/2020 12/26/2019 12/08/2018 12/06/2017  PHQ - 2 Score 2 1 6  0  PHQ- 9 Score 3 2 11  -    GAD 7 : Generalized Anxiety Score 12/30/2020 12/26/2019  Nervous, Anxious, on Edge 0 0  Control/stop worrying 0 0  Worry too much - different things 0 0  Trouble relaxing 0 1  Restless 0 0  Easily annoyed or irritable 0 0  Afraid - awful might happen 0 0  Total GAD 7 Score 0 1  Anxiety Difficulty - Not difficult at all    BP Readings from Last 3 Encounters:  12/30/20 130/82  06/18/20 (!) 149/79  12/26/19 118/78    Physical Exam Vitals and nursing note reviewed.  Constitutional:      General: She is not in acute distress.    Appearance: She is well-developed.  HENT:     Head: Normocephalic and atraumatic.     Right Ear: Tympanic membrane and ear canal normal.     Left Ear: Tympanic membrane and ear canal normal.     Nose:     Right Sinus: No maxillary sinus tenderness.     Left Sinus: No maxillary sinus tenderness.  Eyes:     General: No scleral icterus.       Right eye: No discharge.        Left eye: No discharge.     Conjunctiva/sclera: Conjunctivae normal.  Neck:     Thyroid: No thyromegaly.     Vascular: No carotid bruit.  Cardiovascular:     Rate and Rhythm: Normal rate and regular  rhythm.     Pulses: Normal pulses.     Heart sounds: Normal heart sounds.  Pulmonary:     Effort: Pulmonary effort is normal. No respiratory distress.     Breath sounds: No wheezing.  Chest:  Breasts:    Right: No mass, nipple discharge, skin change or tenderness.     Left: No mass, nipple discharge, skin change or tenderness.       Comments: Vague fullness Abdominal:     General: Bowel sounds are normal.     Palpations: Abdomen is soft.     Tenderness: There is no abdominal tenderness.  Musculoskeletal:     Cervical back: Normal range of motion. No erythema.  Right lower leg: No edema.     Left lower leg: No edema.  Lymphadenopathy:     Cervical: No cervical adenopathy.  Skin:    General: Skin is warm and dry.     Findings: No rash.  Neurological:     Mental Status: She is alert and oriented to person, place, and time.     Cranial Nerves: No cranial nerve deficit.     Sensory: No sensory deficit.     Deep Tendon Reflexes: Reflexes are normal and symmetric.  Psychiatric:        Attention and Perception: Attention normal.        Mood and Affect: Mood normal.    Wt Readings from Last 3 Encounters:  12/30/20 160 lb (72.6 kg)  12/26/19 160 lb (72.6 kg)  12/08/18 157 lb 9.6 oz (71.5 kg)    BP 130/82   Pulse 95   Temp 98.6 F (37 C) (Oral)   Ht 5\' 1"  (1.549 m)   Wt 160 lb (72.6 kg)   SpO2 97%   BMI 30.23 kg/m   Assessment and Plan: 1. Annual physical exam Exam is normal except for weight. Encourage regular exercise and appropriate dietary changes. Continue to use clonazepam PRN for anxiety/insomnia  - Comprehensive metabolic panel - Hemoglobin A1c - Lipid panel  2. Essential (primary) hypertension Clinically stable exam with well controlled BP. Tolerating medications without side effects at this time. Pt to continue current regimen and low sodium diet; benefits of regular exercise as able discussed. - CBC with Differential/Platelet - TSH - POCT  urinalysis dipstick  3. Gastro-esophageal reflux disease without esophagitis Stable on pepcid - CBC with Differential/Platelet  4. Abnormal mammogram of right breast Right Dx mammo and [place] scheduled for later this week  5. Melanoma in situ of left lower extremity including hip ([place]) Recommend regular Dermatology follow up at least annually   Partially dictated using [place]. Any errors are unintentional.  Halina Maidens, MD [facility]  12/30/2020

## 2020-12-30 NOTE — Patient Instructions (Signed)
Schedule appt with Dr. Phillip Heal

## 2020-12-31 LAB — COMPREHENSIVE METABOLIC PANEL
ALT: 29 IU/L (ref 0–32)
AST: 24 IU/L (ref 0–40)
Albumin/Globulin Ratio: 1.9 (ref 1.2–2.2)
Albumin: 4.7 g/dL (ref 3.8–4.8)
Alkaline Phosphatase: 96 IU/L (ref 44–121)
BUN/Creatinine Ratio: 28 (ref 12–28)
BUN: 18 mg/dL (ref 8–27)
Bilirubin Total: 0.3 mg/dL (ref 0.0–1.2)
CO2: 26 mmol/L (ref 20–29)
Calcium: 9.7 mg/dL (ref 8.7–10.3)
Chloride: 100 mmol/L (ref 96–106)
Creatinine, Ser: 0.65 mg/dL (ref 0.57–1.00)
Globulin, Total: 2.5 g/dL (ref 1.5–4.5)
Glucose: 101 mg/dL — ABNORMAL HIGH (ref 65–99)
Potassium: 3.9 mmol/L (ref 3.5–5.2)
Sodium: 142 mmol/L (ref 134–144)
Total Protein: 7.2 g/dL (ref 6.0–8.5)
eGFR: 99 mL/min/{1.73_m2} (ref >59)

## 2020-12-31 LAB — CBC WITH DIFFERENTIAL/PLATELET
Basophils Absolute: 0.1 10*3/uL (ref 0.0–0.2)
Basos: 1 %
EOS (ABSOLUTE): 0.1 10*3/uL (ref 0.0–0.4)
Eos: 2 %
Hematocrit: 42 % (ref 34.0–46.6)
Hemoglobin: 14.1 g/dL (ref 11.1–15.9)
Immature Grans (Abs): 0 10*3/uL (ref 0.0–0.1)
Immature Granulocytes: 0 %
Lymphocytes Absolute: 1.4 10*3/uL (ref 0.7–3.1)
Lymphs: 23 %
MCH: 29.8 pg (ref 26.6–33.0)
MCHC: 33.6 g/dL (ref 31.5–35.7)
MCV: 89 fL (ref 79–97)
Monocytes Absolute: 0.4 10*3/uL (ref 0.1–0.9)
Monocytes: 7 %
Neutrophils Absolute: 3.9 10*3/uL (ref 1.4–7.0)
Neutrophils: 67 %
Platelets: 246 10*3/uL (ref 150–450)
RBC: 4.73 x10E6/uL (ref 3.77–5.28)
RDW: 13.1 % (ref 11.7–15.4)
WBC: 5.9 10*3/uL (ref 3.4–10.8)

## 2020-12-31 LAB — LIPID PANEL
Chol/HDL Ratio: 3.3 ratio (ref 0.0–4.4)
Cholesterol, Total: 223 mg/dL — ABNORMAL HIGH (ref 100–199)
HDL: 67 mg/dL (ref >39)
LDL Chol Calc (NIH): 144 mg/dL — ABNORMAL HIGH (ref 0–99)
Triglycerides: 69 mg/dL (ref 0–149)
VLDL Cholesterol Cal: 12 mg/dL (ref 5–40)

## 2020-12-31 LAB — HEMOGLOBIN A1C
Est. average glucose Bld gHb Est-mCnc: 114 mg/dL
Hgb A1c MFr Bld: 5.6 % (ref 4.8–5.6)

## 2020-12-31 LAB — TSH: TSH: 2.05 u[IU]/mL (ref 0.450–4.500)

## 2021-01-01 ENCOUNTER — Ambulatory Visit
Admission: RE | Admit: 2021-01-01 | Discharge: 2021-01-01 | Disposition: A | Discharge status: Home / Self Care | Payer: BC Managed Care – PPO | Source: Ambulatory Visit | Attending: Internal Medicine | Admitting: Internal Medicine

## 2021-01-01 ENCOUNTER — Other Ambulatory Visit: Payer: Self-pay

## 2021-01-01 DIAGNOSIS — R922 Inconclusive mammogram: Secondary | ICD-10-CM | POA: Diagnosis not present

## 2021-01-01 DIAGNOSIS — R928 Other abnormal and inconclusive findings on diagnostic imaging of breast: Secondary | ICD-10-CM | POA: Diagnosis not present

## 2021-01-01 DIAGNOSIS — N6310 Unspecified lump in the right breast, unspecified quadrant: Secondary | ICD-10-CM

## 2021-04-27 ENCOUNTER — Other Ambulatory Visit: Payer: Self-pay | Admitting: Internal Medicine

## 2021-04-27 DIAGNOSIS — I10 Essential (primary) hypertension: Secondary | ICD-10-CM

## 2021-04-27 NOTE — Telephone Encounter (Signed)
Requested Prescriptions  Pending Prescriptions Disp Refills  . hydrochlorothiazide (HYDRODIURIL) 25 MG tablet [Pharmacy Med Name: HYDROCHLOROTHIAZIDE 25 MG TAB] 90 tablet 0    Sig: TAKE 1 TABLET BY MOUTH ONCE DAILY     Cardiovascular: Diuretics - Thiazide Passed - 04/27/2021  1:40 PM      Passed - Ca in normal range and within 360 days    Calcium  Date Value Ref Range Status  12/30/2020 9.7 8.7 - 10.3 mg/dL Final         Passed - Cr in normal range and within 360 days    Creatinine, Ser  Date Value Ref Range Status  12/30/2020 0.65 0.57 - 1.00 mg/dL Final         Passed - K in normal range and within 360 days    Potassium  Date Value Ref Range Status  12/30/2020 3.9 3.5 - 5.2 mmol/L Final         Passed - Na in normal range and within 360 days    Sodium  Date Value Ref Range Status  12/30/2020 142 134 - 144 mmol/L Final         Passed - Last BP in normal range    BP Readings from Last 1 Encounters:  12/30/20 130/82         Passed - Valid encounter within last 6 months    Recent Outpatient Visits          3 months ago Annual physical exam   [facility] Glean Hess, MD   1 year ago Annual physical exam   [facility] Glean Hess, MD   2 years ago Grief reaction   [facility] Glean Hess, MD   2 years ago Situational anxiety   [facility] Glean Hess, MD   3 years ago Annual physical exam   [facility] Glean Hess, MD      Future Appointments            In 2 months Army Melia Jesse Sans, MD [facility], [place]   In 8 months Army Melia, Jesse Sans, MD [facility], [facility]

## 2021-05-05 DIAGNOSIS — L578 Other skin changes due to chronic exposure to nonionizing radiation: Secondary | ICD-10-CM | POA: Diagnosis not present

## 2021-05-05 DIAGNOSIS — Z85820 Personal history of malignant melanoma of skin: Secondary | ICD-10-CM | POA: Diagnosis not present

## 2021-05-05 DIAGNOSIS — L84 Corns and callosities: Secondary | ICD-10-CM | POA: Diagnosis not present

## 2021-05-05 DIAGNOSIS — Z86018 Personal history of other benign neoplasm: Secondary | ICD-10-CM | POA: Diagnosis not present

## 2021-06-27 IMAGING — CR DG CHEST 2V
2 series · 2 of 2 positions shown · non-contrast
Comparison: None.

CLINICAL DATA: Shortness of breath

EXAM:
CHEST - 2 VIEW

[chest pa]
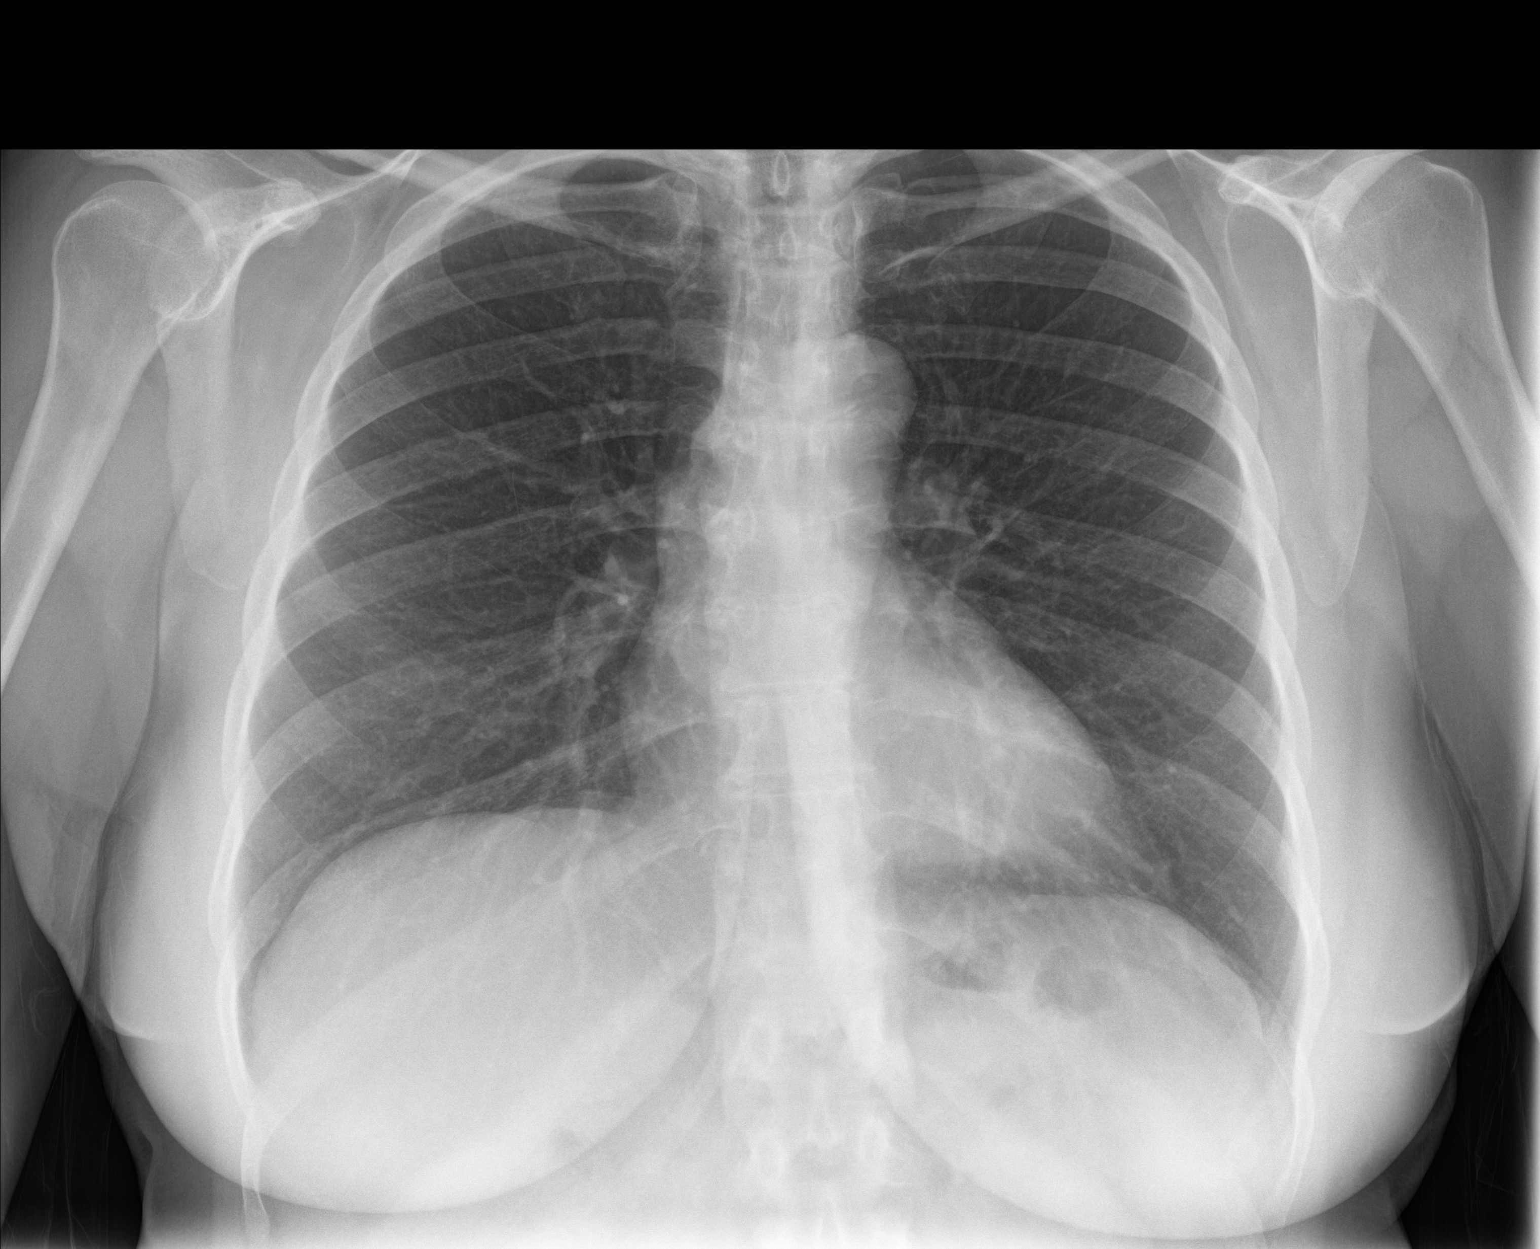

[chest lat]
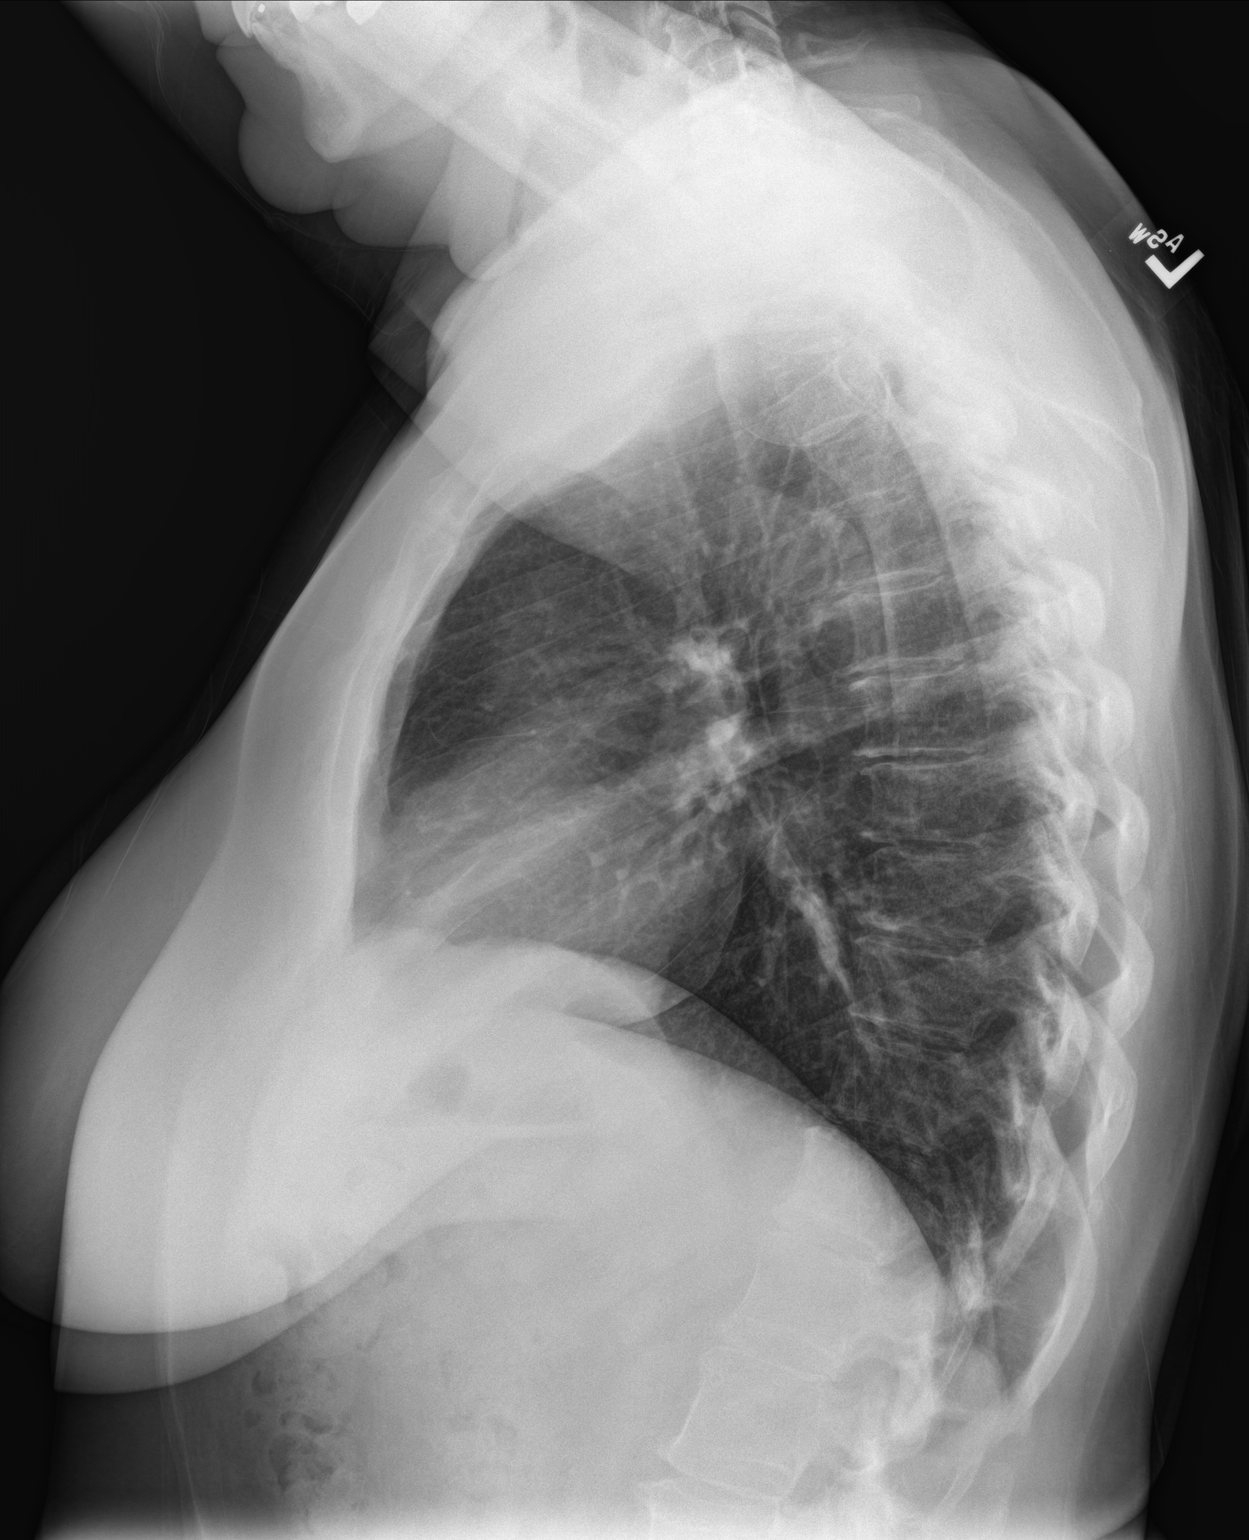

[2 of 2 positions shown; findings below may reference images not displayed]

FINDINGS: The heart size and mediastinal contours are within normal limits.
Both lungs are clear. The visualized skeletal structures are
unremarkable.
IMPRESSION: No active cardiopulmonary disease.

## 2021-07-07 ENCOUNTER — Other Ambulatory Visit: Payer: Self-pay

## 2021-07-07 ENCOUNTER — Ambulatory Visit: Payer: BC Managed Care – PPO | Admitting: Internal Medicine

## 2021-07-07 ENCOUNTER — Encounter: Payer: Self-pay | Admitting: Internal Medicine

## 2021-07-07 VITALS — BP 128/74 | HR 100 | Ht 61.0 in | Wt 160.6 lb

## 2021-07-07 DIAGNOSIS — R21 Rash and other nonspecific skin eruption: Secondary | ICD-10-CM | POA: Diagnosis not present

## 2021-07-07 DIAGNOSIS — I10 Essential (primary) hypertension: Secondary | ICD-10-CM | POA: Diagnosis not present

## 2021-07-07 DIAGNOSIS — Z1231 Encounter for screening mammogram for malignant neoplasm of breast: Secondary | ICD-10-CM | POA: Diagnosis not present

## 2021-07-07 DIAGNOSIS — D0372 Melanoma in situ of left lower limb, including hip: Secondary | ICD-10-CM

## 2021-07-07 MED ORDER — HYDROCHLOROTHIAZIDE 25 MG PO TABS: 25.0000 mg | ORAL_TABLET | Freq: Every day | ORAL | 1 refills | Status: DC

## 2021-07-07 NOTE — Progress Notes (Signed)
Date:  07/07/2021   Name:  Debra Villa   DOB:  [birth year]   MRN:  [id number]   Chief Complaint: Hypertension  Hypertension This is a chronic problem. The problem is controlled (at home controlled). Pertinent negatives include no chest pain, headaches, palpitations or shortness of breath. Past treatments include diuretics. The current treatment provides significant improvement. There is no history of kidney disease, CAD/MI or CVA.  Rash This is a recurrent problem. The problem has been waxing and waning since onset. The affected locations include the chest. The rash is characterized by redness and itchiness. It is unknown if there was an exposure to a precipitant. Pertinent negatives include no cough, diarrhea, fatigue or shortness of breath. Past treatments include topical steroids. The treatment provided significant relief.   Lab Results  Component Value Date   NA 142 12/30/2020   K 3.9 12/30/2020   CO2 26 12/30/2020   GLUCOSE 101 (H) 12/30/2020   BUN 18 12/30/2020   CREATININE 0.65 12/30/2020   CALCIUM 9.7 12/30/2020   EGFR 99 12/30/2020   GFRNONAA 96 12/26/2019   Lab Results  Component Value Date   CHOL 223 (H) 12/30/2020   HDL 67 12/30/2020   LDLCALC 144 (H) 12/30/2020   TRIG 69 12/30/2020   CHOLHDL 3.3 12/30/2020   Lab Results  Component Value Date   TSH 2.050 12/30/2020   Lab Results  Component Value Date   HGBA1C 5.6 12/30/2020   Lab Results  Component Value Date   WBC 5.9 12/30/2020   HGB 14.1 12/30/2020   HCT 42.0 12/30/2020   MCV 89 12/30/2020   PLT 246 12/30/2020   Lab Results  Component Value Date   ALT 29 12/30/2020   AST 24 12/30/2020   ALKPHOS 96 12/30/2020   BILITOT 0.3 12/30/2020   No results found for: 25OHVITD2, 25OHVITD3, VD25OH   Review of Systems  Constitutional:  Negative for fatigue and unexpected weight change.  HENT:  Negative for nosebleeds.   Eyes:  Negative for visual disturbance.  Respiratory:  Negative for cough,  chest tightness, shortness of breath and wheezing.   Cardiovascular:  Negative for chest pain, palpitations and leg swelling.  Gastrointestinal:  Negative for abdominal pain, constipation and diarrhea.  Skin:  Positive for rash.  Neurological:  Negative for dizziness, weakness, light-headedness and headaches.   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   Alopecia 05/06/2015   Essential (primary) hypertension 05/06/2015   Gastro-esophageal reflux disease without esophagitis 05/06/2015   Blood glucose elevated 05/06/2015    Allergies  Allergen Reactions   Wasp Venom Protein Swelling   Hazelnut ([place]) Allergy Skin Test     Past Surgical History:  Procedure Laterality Date   KNEE ARTHROSCOPY     MOLE REMOVAL      Social History   Tobacco Use   Smoking status: Never   Smokeless tobacco: Never  Vaping Use   Vaping Use: Never used  Substance Use Topics   Alcohol use: Yes    Alcohol/week: 2.0 standard drinks    Types: 2 Standard drinks or equivalent per week   Drug use: Never     Medication list has been reviewed and updated.  Current Meds  Medication Sig   clonazePAM (KLONOPIN) 0.5 MG tablet Take 1 tablet (0.5 mg total) by mouth 2 (two) times daily as needed for anxiety.   Famotidine 20 MG CHEW Chew 1 tablet by  mouth daily.   hydrochlorothiazide (HYDRODIURIL) 25 MG tablet TAKE 1 TABLET BY MOUTH ONCE DAILY   Melatonin 5 MG TABS Take 1 tablet by mouth as needed.    meloxicam (MOBIC) 15 MG tablet Take 1 tablet (15 mg total) by mouth daily as needed for pain.   Multiple Vitamins-Minerals (MULTIVITAMIN WOMEN 50+) TABS Take 1 tablet by mouth daily.    PHQ 2/9 Scores 12/30/2020 12/26/2019 12/08/2018 12/06/2017  PHQ - 2 Score 2 1 6  0  PHQ- 9 Score 3 2 11  -    GAD 7 : Generalized Anxiety Score 12/30/2020 12/26/2019  Nervous, Anxious, on Edge 0 0  Control/stop worrying 0 0  Worry too much - different  things 0 0  Trouble relaxing 0 1  Restless 0 0  Easily annoyed or irritable 0 0  Afraid - awful might happen 0 0  Total GAD 7 Score 0 1  Anxiety Difficulty - Not difficult at all    BP Readings from Last 3 Encounters:  07/07/21 128/74  12/30/20 130/82  06/18/20 (!) 149/79    Physical Exam Vitals and nursing note reviewed.  Constitutional:      General: She is not in acute distress.    Appearance: She is well-developed.  HENT:     Head: Normocephalic and atraumatic.  Cardiovascular:     Rate and Rhythm: Normal rate and regular rhythm.     Pulses: Normal pulses.  Pulmonary:     Effort: Pulmonary effort is normal. No respiratory distress.     Breath sounds: No wheezing or rhonchi.  Musculoskeletal:     Cervical back: Normal range of motion.     Right lower leg: No edema.     Left lower leg: No edema.  Lymphadenopathy:     Cervical: No cervical adenopathy.  Skin:    General: Skin is warm and dry.     Findings: Rash present.     Comments: Reddened skin anterior neck/upper chest  Neurological:     Mental Status: She is alert and oriented to person, place, and time.  Psychiatric:        Mood and Affect: Mood normal.        Behavior: Behavior normal.    Wt Readings from Last 3 Encounters:  07/07/21 160 lb 9.6 oz (72.8 kg)  12/30/20 160 lb (72.6 kg)  12/26/19 160 lb (72.6 kg)    BP 128/74    Pulse 100    Ht 5' 1"  (1.549 m)    Wt 160 lb 9.6 oz (72.8 kg)    SpO2 97%    BMI 30.35 kg/m   Assessment and Plan: 1. Essential (primary) hypertension Clinically stable exam with well controlled BP. Tolerating medications without side effects at this time. Pt to continue current regimen and low sodium diet; benefits of regular exercise as able discussed. - hydrochlorothiazide (HYDRODIURIL) 25 MG tablet; Take 1 tablet (25 mg total) by mouth daily.  Dispense: 90 tablet; Refill: 1  2. Rash and nonspecific skin eruption Likely sensitivity reaction to jewelry or clothing. Continue  cortisone cream PRN and try to pinpoint the trigger  3. Encounter for screening mammogram for breast cancer Schedule in June - MM 3D SCREEN BREAST BILATERAL  4. Melanoma in situ of left lower extremity including hip ([place]) Followed by Dermatology   Partially dictated using Dragon software. Any errors are unintentional.  Halina Maidens, MD [facility]  07/07/2021

## 2021-07-16 ENCOUNTER — Telehealth: Payer: Self-pay

## 2021-07-16 NOTE — Telephone Encounter (Signed)
Called pt as a reminder to call and schedule her mammogram. Left VM.  [phone]  KP

## 2021-09-07 ENCOUNTER — Ambulatory Visit: Payer: Self-pay

## 2021-09-07 NOTE — Telephone Encounter (Signed)
?  Chief Complaint: COVID positive ?Symptoms: congestion, headache, cough, taste and smell off ?Frequency: since 09/04/21 ?Pertinent Negatives: Patient denies fever or SOB ?Disposition: [] ED /[] Urgent Care (no appt availability in office) / [] Appointment(In office/virtual)/ []  [facility]/ [x] Home Care/ [] Refused Recommended Disposition /[]  Mobile Bus/ []  Follow-up with PCP ?Additional Notes: pt states she tested positive today, started using Vicks Sinus spray and that helped a lot. Wanted to know if she needed to do anything else. Advised her if symptoms worsen to call back to schedule an appt.  ? ? ?Summary: Covid positive, symptoms  ? Pt has tested positive with an at home test, she says she is congested. Headache easing up. No temperature since Saturday. Has used nasal spray which has helped significantly. Taste is off, smell is off as well  ? ? ?Best contact: [phone]   ?  ? ?Reason for Disposition ? [1] COVID-19 diagnosed by positive lab test (e.g., PCR, rapid self-test kit) AND [2] mild symptoms (e.g., cough, fever, others) AND [6] no complications or SOB ? ?Answer Assessment - Initial Assessment Questions ?1. COVID-19 DIAGNOSIS: "Who made your COVID-19 diagnosis?" "Was it confirmed by a positive lab test or self-test?" If not diagnosed by a doctor (or NP/PA), ask "Are there lots of cases (community spread) where you live?" Note: See public health department website, if unsure. ?    Home test ?3. ONSET: "When did the COVID-19 symptoms start?"  ?    Friday evening ?5. COUGH: "Do you have a cough?" If Yes, ask: "How bad is the cough?"   ?    yes ?6. FEVER: "Do you have a fever?" If Yes, ask: "What is your temperature, how was it measured, and when did it start?" ?    Yes Friday and Saturday ?7. RESPIRATORY STATUS: "Describe your breathing?" (e.g., shortness of breath, wheezing, unable to speak)  ?    No ?8. BETTER-SAME-WORSE: "Are you getting better, staying the same or getting worse  compared to yesterday?"  If getting worse, ask, "In what way?" ?    Some better ?9. HIGH RISK DISEASE: "Do you have any chronic medical problems?" (e.g., asthma, heart or lung disease, weak immune system, obesity, etc.) ?    No ?10. VACCINE: "Have you had the COVID-19 vaccine?" If Yes, ask: "Which one, how many shots, when did you get it?" ?      yes ?11. BOOSTER: "Have you received your COVID-19 booster?" If Yes, ask: "Which one and when did you get it?" ?      yes ?13. OTHER SYMPTOMS: "Do you have any other symptoms?"  (e.g., chills, fatigue, headache, loss of smell or taste, muscle pain, sore throat) ?      Congestion, headache, taste and smell off ? ?Protocols used: Coronavirus (STMHD-62) Diagnosed or Suspected-A-AH ? ?

## 2021-12-24 ENCOUNTER — Ambulatory Visit
Admission: RE | Admit: 2021-12-24 | Discharge: 2021-12-24 | Disposition: A | Discharge status: Home / Self Care | Payer: BC Managed Care – PPO | Source: Ambulatory Visit | Attending: Internal Medicine | Admitting: Internal Medicine

## 2021-12-24 DIAGNOSIS — Z1231 Encounter for screening mammogram for malignant neoplasm of breast: Secondary | ICD-10-CM | POA: Insufficient documentation

## 2022-01-01 ENCOUNTER — Encounter: Payer: BC Managed Care – PPO | Admitting: Internal Medicine

## 2022-01-01 IMAGING — MG MM DIGITAL SCREENING BILAT W/ TOMO AND CAD
8 series · 8 of 24 positions shown · non-contrast
Comparison: Previous exam(s).

CLINICAL DATA: Screening.

EXAM:
DIGITAL SCREENING BILATERAL MAMMOGRAM WITH TOMOSYNTHESIS AND CAD
TECHNIQUE: Bilateral screening digital craniocaudal and mediolateral oblique
mammograms were obtained. Bilateral screening digital breast
tomosynthesis was performed. The images were evaluated with
computer-aided detection.

[L CC synth-2D]
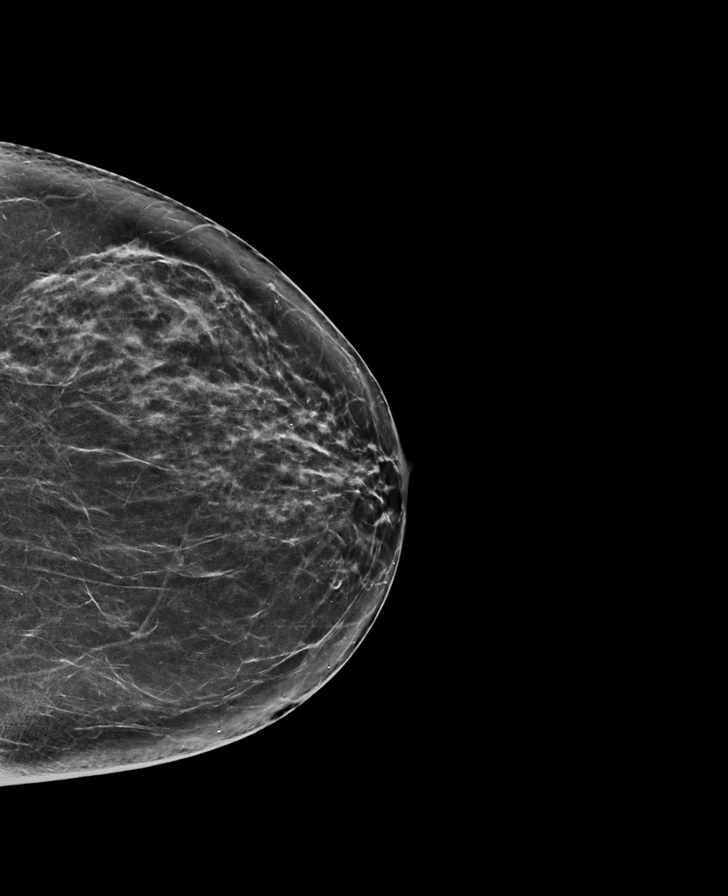

[R MLO synth-2D]
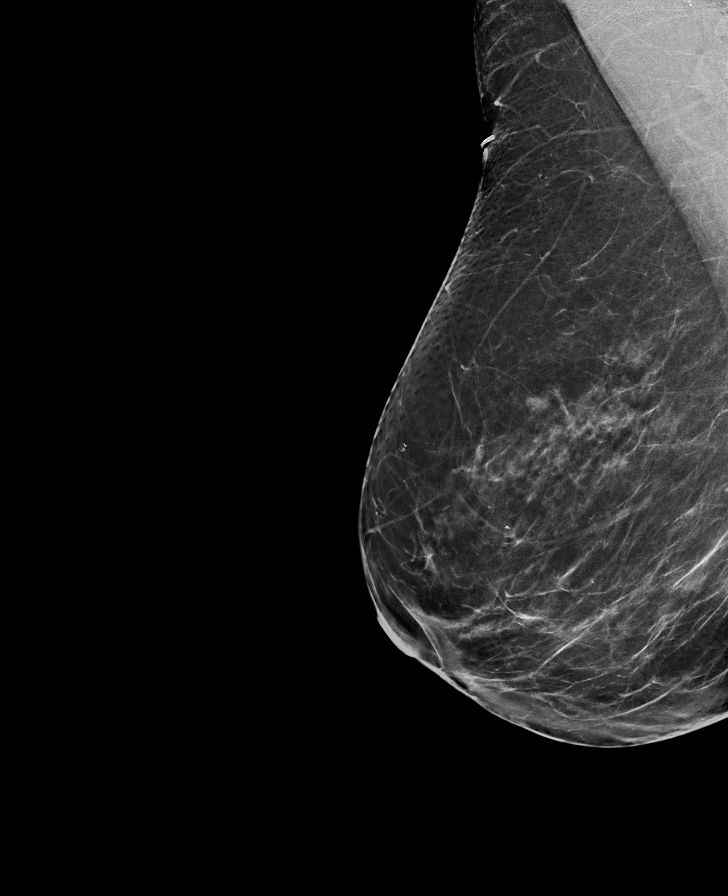

[L MLO synth-2D]
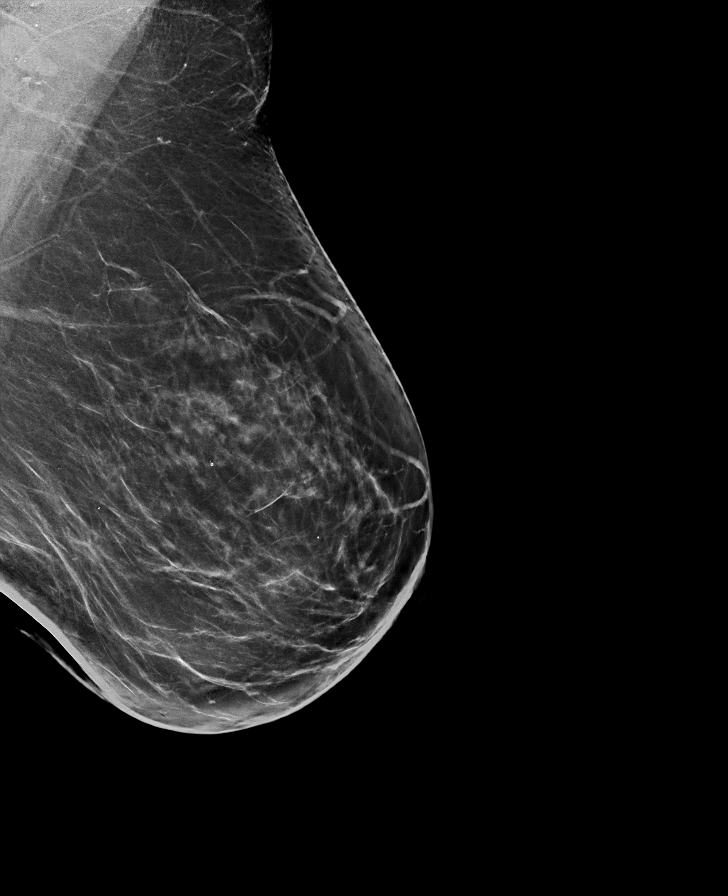

[R CC synth-2D]
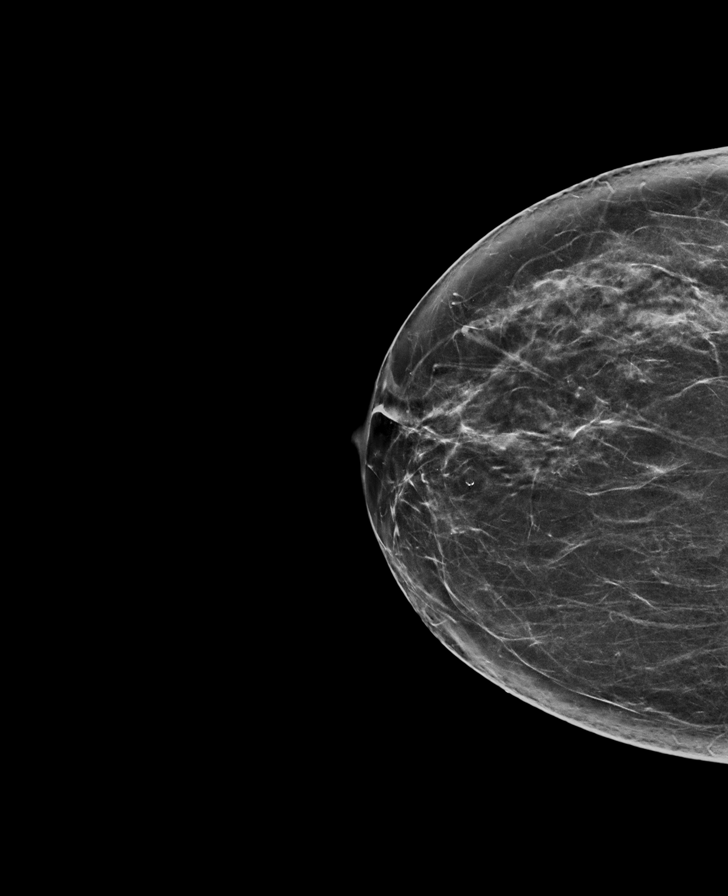

[R CC tomo · tomo slice 35/70.0]
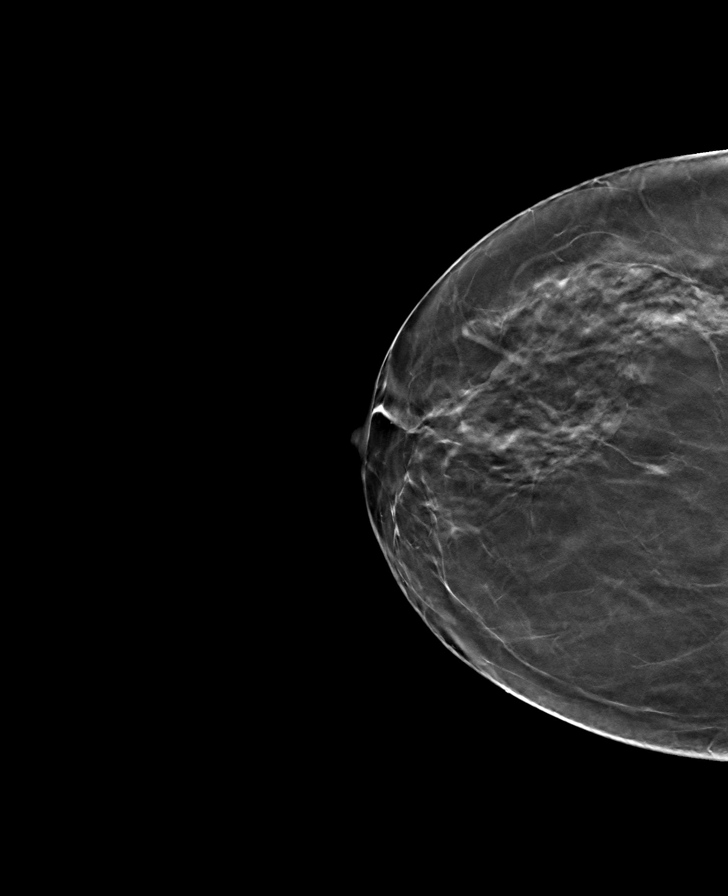

[L CC tomo · tomo slice 36/71.0]
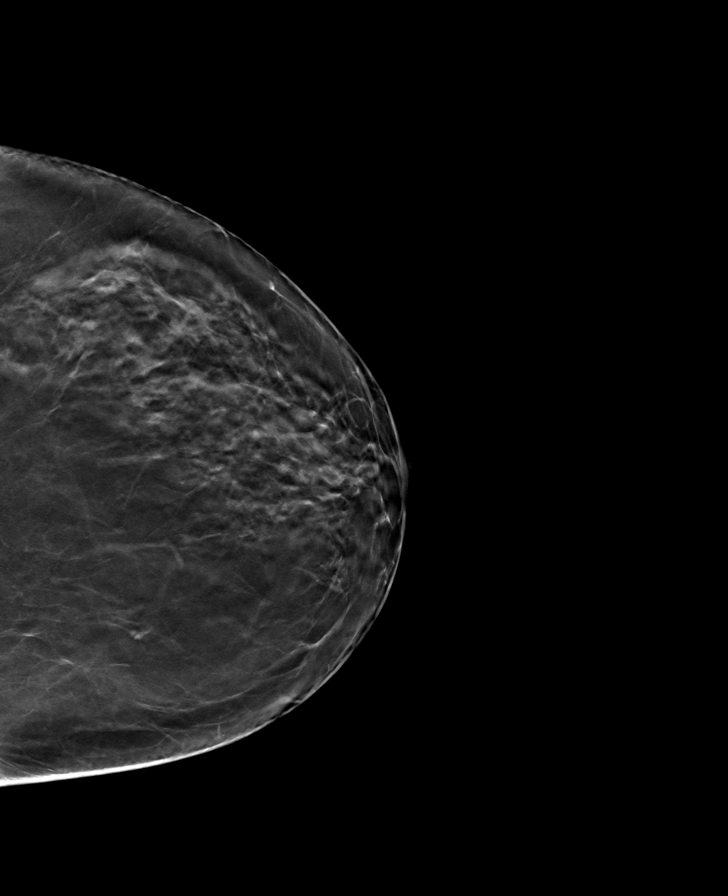

[L MLO tomo · tomo slice 43/84.0]
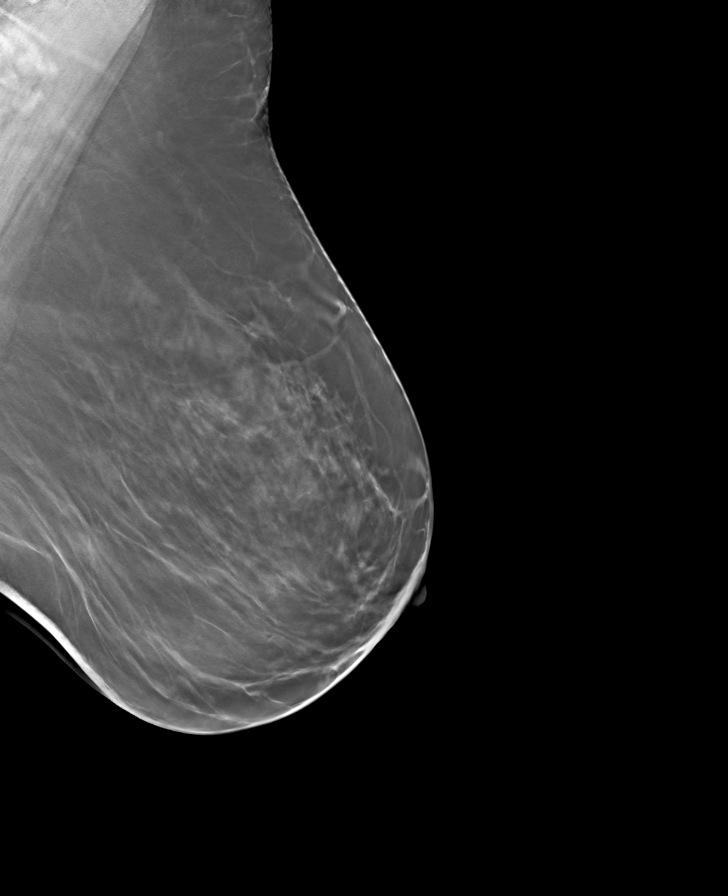

[R MLO tomo · tomo slice 39/77.0]
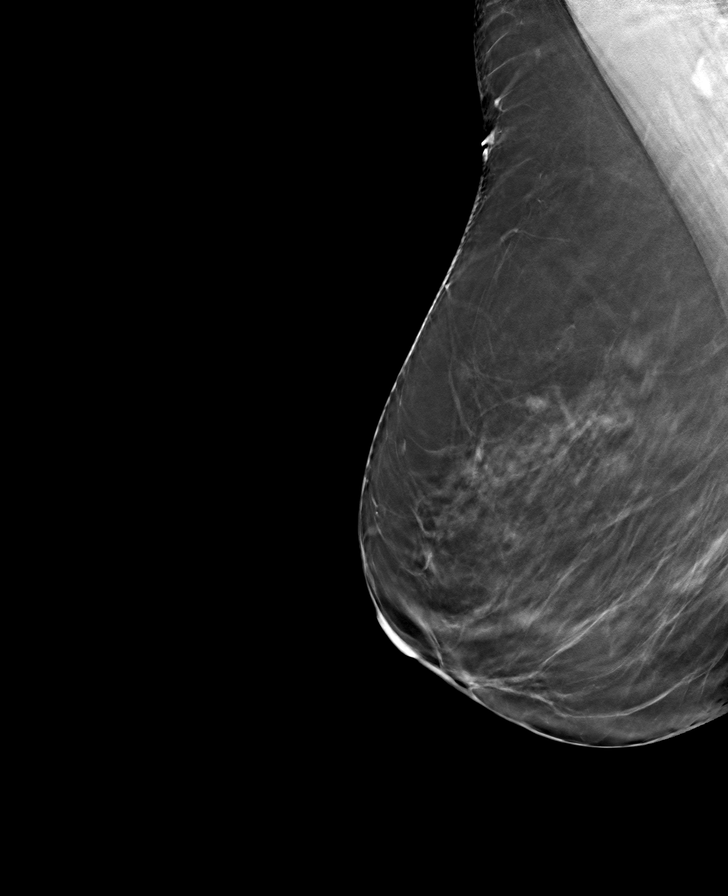

[8 of 24 positions shown; findings below may reference images not displayed]

ACR Breast Density Category b: There are scattered areas of
fibroglandular density.
FINDINGS: In the right breast, a possible mass warrants further evaluation. In
the left breast, no findings suspicious for malignancy.
IMPRESSION: Further evaluation is suggested for a possible mass in the right
breast.

RECOMMENDATION:
Diagnostic mammogram and possibly ultrasound of the right breast.
(Code:[id number]-[id number])

The patient will be contacted regarding the findings, and additional
imaging will be scheduled.

BI-RADS CATEGORY  0: Incomplete. Need additional imaging evaluation
and/or prior mammograms for comparison.

## 2022-02-16 ENCOUNTER — Encounter: Payer: Self-pay | Admitting: Internal Medicine

## 2022-02-16 ENCOUNTER — Ambulatory Visit (INDEPENDENT_AMBULATORY_CARE_PROVIDER_SITE_OTHER): Payer: BC Managed Care – PPO | Admitting: Internal Medicine

## 2022-02-16 VITALS — BP 122/74 | HR 99 | Ht 61.0 in | Wt 161.4 lb

## 2022-02-16 DIAGNOSIS — Z23 Encounter for immunization: Secondary | ICD-10-CM

## 2022-02-16 DIAGNOSIS — K219 Gastro-esophageal reflux disease without esophagitis: Secondary | ICD-10-CM | POA: Diagnosis not present

## 2022-02-16 DIAGNOSIS — R739 Hyperglycemia, unspecified: Secondary | ICD-10-CM | POA: Diagnosis not present

## 2022-02-16 DIAGNOSIS — Z0000 Encounter for general adult medical examination without abnormal findings: Secondary | ICD-10-CM | POA: Diagnosis not present

## 2022-02-16 DIAGNOSIS — E782 Mixed hyperlipidemia: Secondary | ICD-10-CM | POA: Insufficient documentation

## 2022-02-16 DIAGNOSIS — I10 Essential (primary) hypertension: Secondary | ICD-10-CM

## 2022-02-16 NOTE — Patient Instructions (Signed)
Use Saline Nasal spray throughout the day as needed for moisture to the left nasal opening

## 2022-02-16 NOTE — Progress Notes (Signed)
Date:  02/16/2022   Name:  Debra Villa   DOB:  [birth year]   MRN:  [id number]   Chief Complaint: Annual Exam (Declined breast exam.) Debra Villa is a 61 y.o. female who presents today for her Complete Annual Exam. She feels well. She reports exercising - none. She reports she is sleeping well. Breast complaints - none.  Mammogram: 12/2021 DEXA: none Pap smear: 11/2017 repeat 2024 Colonoscopy: Cologuard 02/2020 negative  Health Maintenance Due  Topic Date Due   HIV Screening  Never done   TETANUS/TDAP  Never done   COVID-19 Vaccine (4 - Moderna risk series) 09/26/2020    Immunization History  Administered Date(s) Administered   Influenza,inj,Quad PF,6+ Mos 02/16/2022   Influenza-Unspecified 07/23/2016   Moderna Sars-Covid-2 Vaccination 09/07/2019, 10/05/2019, 08/01/2020   Zoster Recombinat (Shingrix) 12/26/2019, 03/27/2020   Zoster, Live 05/18/2011    Hypertension This is a chronic problem. The problem is controlled. Pertinent negatives include no chest pain, headaches, palpitations or shortness of breath. Past treatments include diuretics. There is no history of kidney disease, CAD/MI or CVA.  Gastroesophageal Reflux She complains of heartburn. She reports no abdominal pain, no chest pain, no coughing or no wheezing. This is a recurrent problem. The problem occurs occasionally. The problem has been gradually improving. Pertinent negatives include no fatigue. She has tried a histamine-2 antagonist (stopped Pepcid and is now using apple cider vinegar) for the symptoms. The treatment provided significant relief.    Lab Results  Component Value Date   NA 142 12/30/2020   K 3.9 12/30/2020   CO2 26 12/30/2020   GLUCOSE 101 (H) 12/30/2020   BUN 18 12/30/2020   CREATININE 0.65 12/30/2020   CALCIUM 9.7 12/30/2020   EGFR 99 12/30/2020   GFRNONAA 96 12/26/2019   Lab Results  Component Value Date   CHOL 223 (H) 12/30/2020   HDL 67 12/30/2020   LDLCALC 144 (H)  12/30/2020   TRIG 69 12/30/2020   CHOLHDL 3.3 12/30/2020   Lab Results  Component Value Date   TSH 2.050 12/30/2020   Lab Results  Component Value Date   HGBA1C 5.6 12/30/2020   Lab Results  Component Value Date   WBC 5.9 12/30/2020   HGB 14.1 12/30/2020   HCT 42.0 12/30/2020   MCV 89 12/30/2020   PLT 246 12/30/2020   Lab Results  Component Value Date   ALT 29 12/30/2020   AST 24 12/30/2020   ALKPHOS 96 12/30/2020   BILITOT 0.3 12/30/2020   No results found for: "25OHVITD2", "25OHVITD3", "VD25OH"   Review of Systems  Constitutional:  Negative for chills, fatigue and fever.  HENT:  Negative for congestion, hearing loss, tinnitus, trouble swallowing and voice change.   Eyes:  Negative for visual disturbance.  Respiratory:  Negative for cough, chest tightness, shortness of breath and wheezing.   Cardiovascular:  Negative for chest pain, palpitations and leg swelling.  Gastrointestinal:  Positive for heartburn. Negative for abdominal pain, constipation, diarrhea and vomiting.  Endocrine: Negative for polydipsia and polyuria.  Genitourinary:  Negative for dysuria, frequency, genital sores, vaginal bleeding and vaginal discharge.  Musculoskeletal:  Negative for arthralgias, gait problem and joint swelling.  Skin:  Negative for color change and rash.  Neurological:  Negative for dizziness, tremors, light-headedness and headaches.  Hematological:  Negative for adenopathy. Does not bruise/bleed easily.  Psychiatric/Behavioral:  Negative for dysphoric mood and sleep disturbance. The patient is not nervous/anxious.     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   Alopecia 05/06/2015   Essential (primary) hypertension 05/06/2015   Gastro-esophageal reflux disease without esophagitis 05/06/2015   Blood glucose elevated 05/06/2015    Allergies  Allergen Reactions   Wasp  Venom Protein Swelling   Hazelnut ([place]) Allergy Skin Test     Past Surgical History:  Procedure Laterality Date   KNEE ARTHROSCOPY     MOLE REMOVAL      Social History   Tobacco Use   Smoking status: Never   Smokeless tobacco: Never  Vaping Use   Vaping Use: Never used  Substance Use Topics   Alcohol use: Yes    Alcohol/week: 2.0 standard drinks of alcohol    Types: 2 Standard drinks or equivalent per week   Drug use: Never     Medication list has been reviewed and updated.  Current Meds  Medication Sig   clonazePAM (KLONOPIN) 0.5 MG tablet Take 1 tablet (0.5 mg total) by mouth 2 (two) times daily as needed for anxiety.   hydrochlorothiazide (HYDRODIURIL) 25 MG tablet Take 1 tablet (25 mg total) by mouth daily.   Melatonin 5 MG TABS Take 1 tablet by mouth as needed.    meloxicam (MOBIC) 15 MG tablet Take 1 tablet (15 mg total) by mouth daily as needed for pain.   Multiple Vitamins-Minerals (MULTIVITAMIN WOMEN 50+) TABS Take 1 tablet by mouth daily.       02/16/2022   10:02 AM 07/07/2021    9:05 AM 12/30/2020    9:18 AM 12/26/2019    9:52 AM  GAD 7 : Generalized Anxiety Score  Nervous, Anxious, on Edge 1 0 0 0  Control/stop worrying 0 0 0 0  Worry too much - different things 1 0 0 0  Trouble relaxing 0 0 0 1  Restless 1 0 0 0  Easily annoyed or irritable 0 0 0 0  Afraid - awful might happen 1 0 0 0  Total GAD 7 Score 4 0 0 1  Anxiety Difficulty Not difficult at all Not difficult at all  Not difficult at all       02/16/2022   10:02 AM 07/07/2021    9:05 AM 12/30/2020    9:18 AM  Depression screen PHQ 2/9  Decreased Interest 1 0 1  Down, Depressed, Hopeless 1 0 1  PHQ - 2 Score 2 0 2  Altered sleeping 0 0 0  Tired, decreased energy 0 0 1  Change in appetite 0 0 0  Feeling bad or failure about yourself  0 0 0  Trouble concentrating 0 0 0  Moving slowly or fidgety/restless 0 0 0  Suicidal thoughts 0 0 0  PHQ-9 Score 2 0 3  Difficult doing work/chores Not  difficult at all Not difficult at all Not difficult at all    BP Readings from Last 3 Encounters:  02/16/22 122/74  07/07/21 128/74  12/30/20 130/82    Physical Exam Vitals and nursing note reviewed.  Constitutional:      General: She is not in acute distress.    Appearance: She is well-developed.  HENT:     Head: Normocephalic and atraumatic.     Right Ear: Tympanic membrane and ear canal normal.     Left Ear: Tympanic membrane and ear canal normal.     Nose:     Right Sinus: No maxillary sinus tenderness.     Left Sinus: No  maxillary sinus tenderness.  Eyes:     General: No scleral icterus.       Right eye: No discharge.        Left eye: No discharge.     Conjunctiva/sclera: Conjunctivae normal.  Neck:     Thyroid: No thyromegaly.     Vascular: No carotid bruit.  Cardiovascular:     Rate and Rhythm: Normal rate and regular rhythm.     Pulses: Normal pulses.     Heart sounds: Normal heart sounds.  Pulmonary:     Effort: Pulmonary effort is normal. No respiratory distress.     Breath sounds: No wheezing.  Abdominal:     General: Bowel sounds are normal.     Palpations: Abdomen is soft.     Tenderness: There is no abdominal tenderness.  Musculoskeletal:     Cervical back: Normal range of motion. No erythema.     Right lower leg: No edema.     Left lower leg: No edema.  Lymphadenopathy:     Cervical: No cervical adenopathy.  Skin:    General: Skin is warm and dry.     Findings: No rash.  Neurological:     Mental Status: She is alert and oriented to person, place, and time.     Cranial Nerves: No cranial nerve deficit.     Sensory: No sensory deficit.     Deep Tendon Reflexes: Reflexes are normal and symmetric.  Psychiatric:        Attention and Perception: Attention normal.        Mood and Affect: Mood normal.     Wt Readings from Last 3 Encounters:  02/16/22 161 lb 6.4 oz (73.2 kg)  07/07/21 160 lb 9.6 oz (72.8 kg)  12/30/20 160 lb (72.6 kg)    BP  122/74   Pulse 99   Ht 5' 1"  (1.549 m)   Wt 161 lb 6.4 oz (73.2 kg)   SpO2 95%   BMI 30.50 kg/m   Assessment and Plan: 1. Annual physical exam Normal exam. Continue healthy diet; recommend regular exercise such as walking - CBC with Differential/Platelet - Comprehensive metabolic panel - Hemoglobin A1c - Lipid panel - TSH - Urinalysis, Routine w reflex microscopic  2. Essential (primary) hypertension Clinically stable exam with well controlled BP. Tolerating medications without side effects at this time. Pt to continue current regimen and low sodium diet; benefits of regular exercise as able discussed. - Comprehensive metabolic panel - TSH - Urinalysis, Routine w reflex microscopic  3. Gastro-esophageal reflux disease without esophagitis Symptoms  controlled with prn vinegar Can also use Pepcid if needed - CBC with Differential/Platelet  4. Blood glucose elevated Rule out pre-diabetes - Hemoglobin A1c  5. Mixed hyperlipidemia Low fat diet recommended - may need to start a statin - Lipid panel  6. Need for immunization against influenza - Flu Vaccine QUAD 60moIM (Fluarix, Fluzone & Alfiuria Quad PF)   Partially dictated using DEditor, commissioning Any errors are unintentional.  LHalina Maidens MD [facility]  02/16/2022

## 2022-02-17 ENCOUNTER — Telehealth: Payer: Self-pay

## 2022-02-17 LAB — CBC WITH DIFFERENTIAL/PLATELET
Basophils Absolute: 0 10*3/uL (ref 0.0–0.2)
Basos: 1 %
EOS (ABSOLUTE): 0.2 10*3/uL (ref 0.0–0.4)
Eos: 5 %
Hematocrit: 41.6 % (ref 34.0–46.6)
Hemoglobin: 14.3 g/dL (ref 11.1–15.9)
Immature Grans (Abs): 0 10*3/uL (ref 0.0–0.1)
Immature Granulocytes: 0 %
Lymphocytes Absolute: 1.4 10*3/uL (ref 0.7–3.1)
Lymphs: 28 %
MCH: 30.1 pg (ref 26.6–33.0)
MCHC: 34.4 g/dL (ref 31.5–35.7)
MCV: 88 fL (ref 79–97)
Monocytes Absolute: 0.4 10*3/uL (ref 0.1–0.9)
Monocytes: 8 %
Neutrophils Absolute: 3.1 10*3/uL (ref 1.4–7.0)
Neutrophils: 58 %
Platelets: 270 10*3/uL (ref 150–450)
RBC: 4.75 x10E6/uL (ref 3.77–5.28)
RDW: 13.1 % (ref 11.7–15.4)
WBC: 5.1 10*3/uL (ref 3.4–10.8)

## 2022-02-17 LAB — COMPREHENSIVE METABOLIC PANEL
ALT: 23 IU/L (ref 0–32)
AST: 17 IU/L (ref 0–40)
Albumin/Globulin Ratio: 1.8 (ref 1.2–2.2)
Albumin: 4.8 g/dL (ref 3.9–4.9)
Alkaline Phosphatase: 95 IU/L (ref 44–121)
BUN/Creatinine Ratio: 15 (ref 12–28)
BUN: 10 mg/dL (ref 8–27)
Bilirubin Total: 0.3 mg/dL (ref 0.0–1.2)
CO2: 26 mmol/L (ref 20–29)
Calcium: 9.6 mg/dL (ref 8.7–10.3)
Chloride: 98 mmol/L (ref 96–106)
Creatinine, Ser: 0.68 mg/dL (ref 0.57–1.00)
Globulin, Total: 2.6 g/dL (ref 1.5–4.5)
Glucose: 98 mg/dL (ref 70–99)
Potassium: 3.9 mmol/L (ref 3.5–5.2)
Sodium: 141 mmol/L (ref 134–144)
Total Protein: 7.4 g/dL (ref 6.0–8.5)
eGFR: 98 mL/min/{1.73_m2} (ref >59)

## 2022-02-17 LAB — URINALYSIS, ROUTINE W REFLEX MICROSCOPIC
Bilirubin, UA: NEGATIVE
Glucose, UA: NEGATIVE
Ketones, UA: NEGATIVE
Leukocytes,UA: NEGATIVE
Nitrite, UA: NEGATIVE
Protein,UA: NEGATIVE
RBC, UA: NEGATIVE
Specific Gravity, UA: 1.007 (ref 1.005–1.030)
Urobilinogen, Ur: 0.2 mg/dL (ref 0.2–1.0)
pH, UA: 7 (ref 5.0–7.5)

## 2022-02-17 LAB — LIPID PANEL
Chol/HDL Ratio: 3.3 ratio (ref 0.0–4.4)
Cholesterol, Total: 236 mg/dL — ABNORMAL HIGH (ref 100–199)
HDL: 72 mg/dL (ref >39)
LDL Chol Calc (NIH): 152 mg/dL — ABNORMAL HIGH (ref 0–99)
Triglycerides: 71 mg/dL (ref 0–149)
VLDL Cholesterol Cal: 12 mg/dL (ref 5–40)

## 2022-02-17 LAB — HEMOGLOBIN A1C
Est. average glucose Bld gHb Est-mCnc: 120 mg/dL
Hgb A1c MFr Bld: 5.8 % — ABNORMAL HIGH (ref 4.8–5.6)

## 2022-02-17 LAB — TSH: TSH: 1.87 u[IU]/mL (ref 0.450–4.500)

## 2022-02-17 NOTE — Telephone Encounter (Signed)
Pt returned our call.   Shared provider's note.   Glean Hess, MD  02/17/2022 12:23 PM EDT     Labs show very early pre-diabetes.  Cholesterol is stable.  No medications are needed yet - just work on low carb and low fat diet.

## 2022-02-26 ENCOUNTER — Other Ambulatory Visit: Payer: Self-pay | Admitting: Internal Medicine

## 2022-02-26 DIAGNOSIS — I10 Essential (primary) hypertension: Secondary | ICD-10-CM

## 2022-03-01 NOTE — Telephone Encounter (Signed)
Requested Prescriptions  Pending Prescriptions Disp Refills  . hydrochlorothiazide (HYDRODIURIL) 25 MG tablet [Pharmacy Med Name: HYDROCHLOROTHIAZIDE 25 MG TAB] 90 tablet 1    Sig: TAKE 1 TABLET BY MOUTH DAILY     Cardiovascular: Diuretics - Thiazide Passed - 02/26/2022  9:54 AM      Passed - Cr in normal range and within 180 days    Creatinine, Ser  Date Value Ref Range Status  02/16/2022 0.68 0.57 - 1.00 mg/dL Final         Passed - K in normal range and within 180 days    Potassium  Date Value Ref Range Status  02/16/2022 3.9 3.5 - 5.2 mmol/L Final         Passed - Na in normal range and within 180 days    Sodium  Date Value Ref Range Status  02/16/2022 141 134 - 144 mmol/L Final         Passed - Last BP in normal range    BP Readings from Last 1 Encounters:  02/16/22 122/74         Passed - Valid encounter within last 6 months    Recent Outpatient Visits          1 week ago Annual physical exam   [place] [facility] at [facility], Jesse Sans, MD   7 months ago Essential (primary) hypertension   [place] [facility] at [facility], Jesse Sans, MD   1 year ago Annual physical exam   [place] [facility] at [facility], Jesse Sans, MD   2 years ago Annual physical exam   [facility] at [facility], Jesse Sans, MD   3 years ago Grief reaction   [facility] at [facility], Jesse Sans, MD      Future Appointments            In 5 months [facility], Jesse Sans, MD [facility] at [facility], [facility]   In 1 year [facility] Melia, Jesse Sans, MD [facility] at [facility], [facility]

## 2022-07-28 DIAGNOSIS — S8002XA Contusion of left knee, initial encounter: Secondary | ICD-10-CM | POA: Diagnosis not present

## 2022-07-28 DIAGNOSIS — M1712 Unilateral primary osteoarthritis, left knee: Secondary | ICD-10-CM | POA: Diagnosis not present

## 2022-07-28 DIAGNOSIS — M79672 Pain in left foot: Secondary | ICD-10-CM | POA: Diagnosis not present

## 2022-08-19 ENCOUNTER — Encounter: Payer: Self-pay | Admitting: Internal Medicine

## 2022-08-19 ENCOUNTER — Ambulatory Visit: Payer: BC Managed Care – PPO | Admitting: Internal Medicine

## 2022-08-19 VITALS — BP 132/82 | HR 86 | Ht 61.0 in | Wt 162.0 lb

## 2022-08-19 DIAGNOSIS — K099 Cyst of oral region, unspecified: Secondary | ICD-10-CM

## 2022-08-19 DIAGNOSIS — I10 Essential (primary) hypertension: Secondary | ICD-10-CM | POA: Diagnosis not present

## 2022-08-19 DIAGNOSIS — R7303 Prediabetes: Secondary | ICD-10-CM | POA: Diagnosis not present

## 2022-08-19 NOTE — Assessment & Plan Note (Signed)
Clinically stable exam with well controlled BP on hctz. Tolerating medications without side effects. Pt to continue current regimen and low sodium diet.

## 2022-08-19 NOTE — Progress Notes (Signed)
Date:  08/19/2022   Name:  Debra Villa   DOB:  [birth year]   MRN:  [id number]   Chief Complaint: Hypertension and Diabetes  Hypertension This is a chronic problem. The current episode started more than 1 year ago. The problem has been waxing and waning since onset. The problem is controlled (at home 125/75). Pertinent negatives include no chest pain, headaches, palpitations or shortness of breath. Past treatments include diuretics. The current treatment provides significant improvement.  Diabetes She presents for her initial diabetic visit. Diabetes type: Pre-diabetes. Pertinent negatives for hypoglycemia include no dizziness or headaches. There are no diabetic associated symptoms. Pertinent negatives for diabetes include no chest pain and no fatigue.  She is working with diet changes - limiting sodium and sweet tea, exercising until she contused her knee 3 weeks ago.   Lab Results  Component Value Date   NA 141 02/16/2022   K 3.9 02/16/2022   CO2 26 02/16/2022   GLUCOSE 98 02/16/2022   BUN 10 02/16/2022   CREATININE 0.68 02/16/2022   CALCIUM 9.6 02/16/2022   EGFR 98 02/16/2022   GFRNONAA 96 12/26/2019   Lab Results  Component Value Date   CHOL 236 (H) 02/16/2022   HDL 72 02/16/2022   LDLCALC 152 (H) 02/16/2022   TRIG 71 02/16/2022   CHOLHDL 3.3 02/16/2022   Lab Results  Component Value Date   TSH 1.870 02/16/2022   Lab Results  Component Value Date   HGBA1C 5.8 (H) 02/16/2022   Lab Results  Component Value Date   WBC 5.1 02/16/2022   HGB 14.3 02/16/2022   HCT 41.6 02/16/2022   MCV 88 02/16/2022   PLT 270 02/16/2022   Lab Results  Component Value Date   ALT 23 02/16/2022   AST 17 02/16/2022   ALKPHOS 95 02/16/2022   BILITOT 0.3 02/16/2022   No results found for: "25OHVITD2", "25OHVITD3", "VD25OH"   Review of Systems  Constitutional:  Negative for chills, fatigue and fever.  Respiratory:  Negative for chest tightness, shortness of breath and  wheezing.   Cardiovascular:  Negative for chest pain and palpitations.  Neurological:  Negative for dizziness and headaches.    Patient Active Problem List   Diagnosis Date Noted   Prediabetes 08/19/2022   Mixed hyperlipidemia 02/16/2022   Melanoma in situ (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   Alopecia 05/06/2015   Essential (primary) hypertension 05/06/2015   Gastro-esophageal reflux disease without esophagitis 05/06/2015   Blood glucose elevated 05/06/2015    Allergies  Allergen Reactions   Wasp Venom Protein Swelling   Hazelnut ([place]) Allergy Skin Test     Past Surgical History:  Procedure Laterality Date   KNEE ARTHROSCOPY     MOLE REMOVAL      Social History   Tobacco Use   Smoking status: Never   Smokeless tobacco: Never  Vaping Use   Vaping Use: Never used  Substance Use Topics   Alcohol use: Yes    Alcohol/week: 2.0 standard drinks of alcohol    Types: 2 Standard drinks or equivalent per week   Drug use: Never     Medication list has been reviewed and updated.  Current Meds  Medication Sig   Apoaequorin (PREVAGEN) 10 MG CAPS Take 10 mg by mouth daily.   clonazePAM (KLONOPIN) 0.5 MG tablet Take 1 tablet (0.5 mg total) by mouth 2 (two) times daily as needed for anxiety.   hydrochlorothiazide (HYDRODIURIL) 25 MG tablet TAKE 1 TABLET BY MOUTH DAILY  Melatonin 5 MG TABS Take 1 tablet by mouth as needed.    meloxicam (MOBIC) 15 MG tablet Take 1 tablet (15 mg total) by mouth daily as needed for pain.   Multiple Vitamins-Minerals (MULTIVITAMIN WOMEN 50+) TABS Take 1 tablet by mouth daily.       08/19/2022    9:19 AM 02/16/2022   10:02 AM 07/07/2021    9:05 AM 12/30/2020    9:18 AM  GAD 7 : Generalized Anxiety Score  Nervous, Anxious, on Edge 1 1 0 0  Control/stop worrying 0 0 0 0  Worry too much - different things 1 1 0 0  Trouble relaxing 1 0 0 0  Restless 1 1 0 0  Easily annoyed or irritable 1 0 0 0  Afraid - awful might happen 0 1 0 0   Total GAD 7 Score 5 4 0 0  Anxiety Difficulty Not difficult at all Not difficult at all Not difficult at all        08/19/2022    9:19 AM 02/16/2022   10:02 AM 07/07/2021    9:05 AM  Depression screen PHQ 2/9  Decreased Interest 1 1 0  Down, Depressed, Hopeless 1 1 0  PHQ - 2 Score 2 2 0  Altered sleeping 1 0 0  Tired, decreased energy 1 0 0  Change in appetite 0 0 0  Feeling bad or failure about yourself  0 0 0  Trouble concentrating 0 0 0  Moving slowly or fidgety/restless 0 0 0  Suicidal thoughts 0 0 0  PHQ-9 Score 4 2 0  Difficult doing work/chores Not difficult at all Not difficult at all Not difficult at all    BP Readings from Last 3 Encounters:  08/19/22 132/82  02/16/22 122/74  07/07/21 128/74    Physical Exam Vitals and nursing note reviewed.  Constitutional:      General: She is not in acute distress.    Appearance: Normal appearance. She is well-developed.  HENT:     Head: Normocephalic and atraumatic.     Mouth/Throat:     Comments: 0.3 cm smooth pale cyst of right buccal mucosa Cardiovascular:     Rate and Rhythm: Normal rate and regular rhythm.  Pulmonary:     Effort: Pulmonary effort is normal. No respiratory distress.     Breath sounds: No wheezing or rhonchi.  Musculoskeletal:     Cervical back: Normal range of motion.     Right lower leg: No edema.     Left lower leg: No edema.  Lymphadenopathy:     Cervical: No cervical adenopathy.  Skin:    General: Skin is warm and dry.     Capillary Refill: Capillary refill takes less than 2 seconds.     Findings: No rash.  Neurological:     Mental Status: She is alert and oriented to person, place, and time.  Psychiatric:        Mood and Affect: Mood normal.        Behavior: Behavior normal.     Wt Readings from Last 3 Encounters:  08/19/22 162 lb (73.5 kg)  02/16/22 161 lb 6.4 oz (73.2 kg)  07/07/21 160 lb 9.6 oz (72.8 kg)    BP 132/82 (BP Location: Right Arm, Cuff Size: Normal)   Pulse 86    Ht '5\' 1"'$  (1.549 m)   Wt 162 lb (73.5 kg)   SpO2 97%   BMI 30.61 kg/m   Assessment and Plan: Problem List Items Addressed  This Visit       Cardiovascular and Mediastinum   Essential (primary) hypertension - Primary (Chronic)    Clinically stable exam with well controlled BP on hctz. Tolerating medications without side effects. Pt to continue current regimen and low sodium diet.         Other   Prediabetes    Continue reduced carb diet choices Will recheck next visit      Other Visit Diagnoses     Cyst of oral region       appears benign and should resolve if worsening, will refer to oral surgery        Partially dictated using Dragon software. Any errors are unintentional.  Halina Maidens, MD [facility]  08/19/2022

## 2022-08-19 NOTE — Assessment & Plan Note (Signed)
Continue reduced carb diet choices Will recheck next visit

## 2022-09-21 ENCOUNTER — Other Ambulatory Visit: Payer: Self-pay | Admitting: Internal Medicine

## 2022-09-21 DIAGNOSIS — I10 Essential (primary) hypertension: Secondary | ICD-10-CM

## 2022-12-02 ENCOUNTER — Other Ambulatory Visit: Payer: Self-pay | Admitting: Internal Medicine

## 2022-12-02 DIAGNOSIS — Z1231 Encounter for screening mammogram for malignant neoplasm of breast: Secondary | ICD-10-CM

## 2022-12-27 ENCOUNTER — Ambulatory Visit
Admission: RE | Admit: 2022-12-27 | Discharge: 2022-12-27 | Disposition: A | Discharge status: Home / Self Care | Payer: BC Managed Care – PPO | Source: Ambulatory Visit | Attending: Internal Medicine | Admitting: Internal Medicine

## 2022-12-27 DIAGNOSIS — Z1231 Encounter for screening mammogram for malignant neoplasm of breast: Secondary | ICD-10-CM | POA: Insufficient documentation

## 2023-03-01 ENCOUNTER — Encounter: Payer: BC Managed Care – PPO | Admitting: Internal Medicine

## 2023-03-22 ENCOUNTER — Other Ambulatory Visit: Payer: Self-pay | Admitting: Internal Medicine

## 2023-03-22 DIAGNOSIS — I10 Essential (primary) hypertension: Secondary | ICD-10-CM

## 2023-08-16 ENCOUNTER — Encounter: Payer: Self-pay | Admitting: Internal Medicine

## 2023-08-16 ENCOUNTER — Ambulatory Visit (INDEPENDENT_AMBULATORY_CARE_PROVIDER_SITE_OTHER): Payer: BC Managed Care – PPO | Admitting: Internal Medicine

## 2023-08-16 VITALS — BP 145/92 | HR 77 | Ht 61.0 in | Wt 160.0 lb

## 2023-08-16 DIAGNOSIS — Z1231 Encounter for screening mammogram for malignant neoplasm of breast: Secondary | ICD-10-CM | POA: Diagnosis not present

## 2023-08-16 DIAGNOSIS — I10 Essential (primary) hypertension: Secondary | ICD-10-CM

## 2023-08-16 DIAGNOSIS — F418 Other specified anxiety disorders: Secondary | ICD-10-CM

## 2023-08-16 DIAGNOSIS — R7303 Prediabetes: Secondary | ICD-10-CM

## 2023-08-16 DIAGNOSIS — Z124 Encounter for screening for malignant neoplasm of cervix: Secondary | ICD-10-CM | POA: Diagnosis not present

## 2023-08-16 DIAGNOSIS — E782 Mixed hyperlipidemia: Secondary | ICD-10-CM

## 2023-08-16 DIAGNOSIS — Z0000 Encounter for general adult medical examination without abnormal findings: Secondary | ICD-10-CM | POA: Diagnosis not present

## 2023-08-16 DIAGNOSIS — Z23 Encounter for immunization: Secondary | ICD-10-CM | POA: Diagnosis not present

## 2023-08-16 DIAGNOSIS — Z1211 Encounter for screening for malignant neoplasm of colon: Secondary | ICD-10-CM

## 2023-08-16 DIAGNOSIS — D0372 Melanoma in situ of left lower limb, including hip: Secondary | ICD-10-CM

## 2023-08-16 MED ORDER — CLONAZEPAM 0.5 MG PO TABS: 0.5000 mg | ORAL_TABLET | Freq: Two times a day (BID) | ORAL | 0 refills | Status: DC | PRN

## 2023-08-16 NOTE — Assessment & Plan Note (Addendum)
 BP elevated today with normal exam. Current regimen is hydrochlorothiazide.   Will have her monitor at home and send readings in 2 weeks.

## 2023-08-16 NOTE — Progress Notes (Signed)
 Date:  08/16/2023   Name:  Debra Villa   DOB:  Jul 12, 1960   MRN:  [id number]   Chief Complaint: Annual Exam Debra Villa is a 63 y.o. female who presents today for her Complete Annual Exam. She feels fairly well. She reports exercising stair climber once a week 10-12 mins. She reports she is sleeping well. Breast complaints none.  Health Maintenance  Topic Date Due   HIV Screening  Never done   DTaP/Tdap/Td vaccine (1 - Tdap) Never done   Pap with HPV screening  12/07/2022   Flu Shot  01/20/2023   COVID-19 Vaccine (4 - 2024-25 season) 02/20/2023   Cologuard (Stool DNA test)  03/14/2023   Mammogram  12/27/2023   Zoster (Shingles) Vaccine  Completed   Hepatitis C Screening  Addressed   HPV Vaccine  Aged Out   Colon Cancer Screening  Discontinued    Hypertension This is a chronic problem. The problem is controlled. Pertinent negatives include no chest pain, headaches, palpitations or shortness of breath. Past treatments include diuretics. The current treatment provides significant improvement.    Review of Systems  Constitutional:  Negative for chills, fatigue and fever.  HENT:  Negative for congestion, hearing loss, tinnitus, trouble swallowing and voice change.   Eyes:  Negative for visual disturbance.  Respiratory:  Negative for cough, chest tightness, shortness of breath and wheezing.   Cardiovascular:  Negative for chest pain, palpitations and leg swelling.  Gastrointestinal:  Negative for abdominal pain, constipation, diarrhea and vomiting.  Endocrine: Negative for polydipsia and polyuria.  Genitourinary:  Negative for dysuria, frequency, genital sores, vaginal bleeding and vaginal discharge.  Musculoskeletal:  Negative for arthralgias, gait problem and joint swelling.  Skin:  Negative for color change and rash.  Neurological:  Negative for dizziness, tremors, light-headedness and headaches.  Hematological:  Negative for adenopathy. Does not bruise/bleed easily.   Psychiatric/Behavioral:  Negative for dysphoric mood and sleep disturbance. The patient is not nervous/anxious.      Lab Results  Component Value Date   NA 141 02/16/2022   K 3.9 02/16/2022   CO2 26 02/16/2022   GLUCOSE 98 02/16/2022   BUN 10 02/16/2022   CREATININE 0.68 02/16/2022   CALCIUM 9.6 02/16/2022   EGFR 98 02/16/2022   GFRNONAA 96 12/26/2019   Lab Results  Component Value Date   CHOL 236 (H) 02/16/2022   HDL 72 02/16/2022   LDLCALC 152 (H) 02/16/2022   TRIG 71 02/16/2022   CHOLHDL 3.3 02/16/2022   Lab Results  Component Value Date   TSH 1.870 02/16/2022   Lab Results  Component Value Date   HGBA1C 5.8 (H) 02/16/2022   Lab Results  Component Value Date   WBC 5.1 02/16/2022   HGB 14.3 02/16/2022   HCT 41.6 02/16/2022   MCV 88 02/16/2022   PLT 270 02/16/2022   Lab Results  Component Value Date   ALT 23 02/16/2022   AST 17 02/16/2022   ALKPHOS 95 02/16/2022   BILITOT 0.3 02/16/2022   No results found for: "25OHVITD2", "25OHVITD3", "VD25OH"   Patient Active Problem List   Diagnosis Date Noted   Prediabetes 08/19/2022   Mixed hyperlipidemia 02/16/2022   Melanoma in situ (HCC) 12/03/2016   Achrochordon 05/06/2015   Alopecia 05/06/2015   Essential (primary) hypertension 05/06/2015   Gastro-esophageal reflux disease without esophagitis 05/06/2015    Allergies  Allergen Reactions   Wasp Venom Protein Swelling   Hazelnut (Filbert)     Past Surgical History:  Procedure Laterality Date   KNEE ARTHROSCOPY     MOLE REMOVAL      Social History   Tobacco Use   Smoking status: Never   Smokeless tobacco: Never  Vaping Use   Vaping status: Never Used  Substance Use Topics   Alcohol use: Yes    Alcohol/week: 2.0 standard drinks of alcohol    Types: 2 Standard drinks or equivalent per week   Drug use: Never     Medication list has been reviewed and updated.  Current Meds  Medication Sig   Apoaequorin (PREVAGEN) 10 MG CAPS Take 10 mg by  mouth daily.   hydrochlorothiazide (HYDRODIURIL) 25 MG tablet TAKE 1 TABLET BY MOUTH DAILY   Melatonin 5 MG TABS Take 1 tablet by mouth as needed.    meloxicam (MOBIC) 15 MG tablet Take 1 tablet (15 mg total) by mouth daily as needed for pain.   Multiple Vitamins-Minerals (MULTIVITAMIN WOMEN 50+) TABS Take 1 tablet by mouth daily.   [DISCONTINUED] clonazePAM (KLONOPIN) 0.5 MG tablet Take 1 tablet (0.5 mg total) by mouth 2 (two) times daily as needed for anxiety.       08/16/2023    8:06 AM 08/19/2022    9:19 AM 02/16/2022   10:02 AM 07/07/2021    9:05 AM  GAD 7 : Generalized Anxiety Score  Nervous, Anxious, on Edge 1 1 1  0  Control/stop worrying 0 0 0 0  Worry too much - different things 1 1 1  0  Trouble relaxing 1 1 0 0  Restless 1 1 1  0  Easily annoyed or irritable 0 1 0 0  Afraid - awful might happen 0 0 1 0  Total GAD 7 Score 4 5 4  0  Anxiety Difficulty Not difficult at all Not difficult at all Not difficult at all Not difficult at all       08/16/2023    8:05 AM 08/19/2022    9:19 AM 02/16/2022   10:02 AM  Depression screen PHQ 2/9  Decreased Interest 1 1 1   Down, Depressed, Hopeless 1 1 1   PHQ - 2 Score 2 2 2   Altered sleeping 1 1 0  Tired, decreased energy 1 1 0  Change in appetite 0 0 0  Feeling bad or failure about yourself  0 0 0  Trouble concentrating 0 0 0  Moving slowly or fidgety/restless 0 0 0  Suicidal thoughts 0 0 0  PHQ-9 Score 4 4 2   Difficult doing work/chores Not difficult at all Not difficult at all Not difficult at all    BP Readings from Last 3 Encounters:  08/16/23 (!) 145/92  08/19/22 132/82  02/16/22 122/74    Physical Exam Vitals and nursing note reviewed.  Constitutional:      General: She is not in acute distress.    Appearance: Normal appearance. She is well-developed.  HENT:     Head: Normocephalic and atraumatic.     Right Ear: Tympanic membrane and ear canal normal.     Left Ear: Tympanic membrane and ear canal normal.     Nose:      Right Sinus: No maxillary sinus tenderness.     Left Sinus: No maxillary sinus tenderness.  Eyes:     General: No scleral icterus.       Right eye: No discharge.        Left eye: No discharge.     Conjunctiva/sclera: Conjunctivae normal.  Neck:     Thyroid: No thyromegaly.  Vascular: No carotid bruit.  Cardiovascular:     Rate and Rhythm: Normal rate and regular rhythm.     Pulses: Normal pulses.     Heart sounds: Normal heart sounds.  Pulmonary:     Effort: Pulmonary effort is normal. No respiratory distress.     Breath sounds: No wheezing.  Chest:  Breasts:    Right: No mass, nipple discharge, skin change or tenderness.     Left: No mass, nipple discharge, skin change or tenderness.  Abdominal:     General: Bowel sounds are normal.     Palpations: Abdomen is soft.     Tenderness: There is no abdominal tenderness.  Musculoskeletal:     Cervical back: Normal range of motion. No erythema.     Right lower leg: No edema.     Left lower leg: No edema.  Lymphadenopathy:     Cervical: No cervical adenopathy.  Skin:    General: Skin is warm and dry.     Capillary Refill: Capillary refill takes less than 2 seconds.     Findings: No rash.  Neurological:     General: No focal deficit present.     Mental Status: She is alert and oriented to person, place, and time.     Cranial Nerves: No cranial nerve deficit.     Sensory: No sensory deficit.     Deep Tendon Reflexes: Reflexes are normal and symmetric.  Psychiatric:        Attention and Perception: Attention normal.        Mood and Affect: Mood normal.     Wt Readings from Last 3 Encounters:  08/16/23 160 lb (72.6 kg)  08/19/22 162 lb (73.5 kg)  02/16/22 161 lb 6.4 oz (73.2 kg)    BP (!) 145/92   Pulse 77   Ht 5\' 1"  (1.549 m)   Wt 160 lb (72.6 kg)   SpO2 99%   BMI 30.23 kg/m   Assessment and Plan:  Problem List Items Addressed This Visit       Unprioritized   Essential (primary) hypertension (Chronic)    BP elevated today with normal exam. Current regimen is hydrochlorothiazide.   Will have her monitor at home and send readings in 2 weeks.       Relevant Orders   CBC with Differential/Platelet   Comprehensive metabolic panel   TSH   Urinalysis, Routine w reflex microscopic   Melanoma in situ (HCC)   Mixed hyperlipidemia (Chronic)   Managed with diet only. 10 yr risk low.      Relevant Orders   Lipid panel   Prediabetes   Relevant Orders   Hemoglobin A1c   Other Visit Diagnoses       Annual physical exam    -  Primary   Relevant Orders   CBC with Differential/Platelet   Comprehensive metabolic panel   Hemoglobin A1c   Lipid panel   TSH   Urinalysis, Routine w reflex microscopic     Encounter for screening mammogram for breast cancer       Relevant Orders   MM 3D SCREENING MAMMOGRAM BILATERAL BREAST     Encounter for screening for cervical cancer         Colon cancer screening       Relevant Orders   Cologuard     Immunization due       Relevant Orders   Flu vaccine trivalent PF, 6mos and older(Flulaval,Afluria,Fluarix,Fluzone)   Tdap vaccine greater than or equal to 7yo  IM     Situational anxiety       Relevant Medications   clonazePAM (KLONOPIN) 0.5 MG tablet       Return in about 2 months (around 10/14/2023) for HTN.    Reubin Milan, MD [facility] [place]

## 2023-08-16 NOTE — Assessment & Plan Note (Signed)
 Managed with diet only. 10 yr risk low.

## 2023-08-16 NOTE — Patient Instructions (Signed)
 Check bp daily and record.  After 2 weeks send me the readings to decide about medication.  Goal BP is less than 130/80

## 2023-08-17 ENCOUNTER — Encounter: Payer: Self-pay | Admitting: Internal Medicine

## 2023-08-17 LAB — CBC WITH DIFFERENTIAL/PLATELET
Basophils Absolute: 0 10*3/uL (ref 0.0–0.2)
Basos: 1 %
EOS (ABSOLUTE): 0.1 10*3/uL (ref 0.0–0.4)
Eos: 2 %
Hematocrit: 42.8 % (ref 34.0–46.6)
Hemoglobin: 13.9 g/dL (ref 11.1–15.9)
Immature Grans (Abs): 0 10*3/uL (ref 0.0–0.1)
Immature Granulocytes: 0 %
Lymphocytes Absolute: 1.2 10*3/uL (ref 0.7–3.1)
Lymphs: 23 %
MCH: 28.7 pg (ref 26.6–33.0)
MCHC: 32.5 g/dL (ref 31.5–35.7)
MCV: 88 fL (ref 79–97)
Monocytes Absolute: 0.4 10*3/uL (ref 0.1–0.9)
Monocytes: 7 %
Neutrophils Absolute: 3.6 10*3/uL (ref 1.4–7.0)
Neutrophils: 67 %
Platelets: 262 10*3/uL (ref 150–450)
RBC: 4.84 x10E6/uL (ref 3.77–5.28)
RDW: 13.4 % (ref 11.7–15.4)
WBC: 5.3 10*3/uL (ref 3.4–10.8)

## 2023-08-17 LAB — LIPID PANEL
Chol/HDL Ratio: 3.4 ratio (ref 0.0–4.4)
Cholesterol, Total: 251 mg/dL — ABNORMAL HIGH (ref 100–199)
HDL: 73 mg/dL (ref >39)
LDL Chol Calc (NIH): 165 mg/dL — ABNORMAL HIGH (ref 0–99)
Triglycerides: 79 mg/dL (ref 0–149)
VLDL Cholesterol Cal: 13 mg/dL (ref 5–40)

## 2023-08-17 LAB — COMPREHENSIVE METABOLIC PANEL
ALT: 24 IU/L (ref 0–32)
AST: 19 IU/L (ref 0–40)
Albumin: 4.7 g/dL (ref 3.9–4.9)
Alkaline Phosphatase: 90 IU/L (ref 44–121)
BUN/Creatinine Ratio: 18 (ref 12–28)
BUN: 14 mg/dL (ref 8–27)
Bilirubin Total: 0.4 mg/dL (ref 0.0–1.2)
CO2: 26 mmol/L (ref 20–29)
Calcium: 9.7 mg/dL (ref 8.7–10.3)
Chloride: 100 mmol/L (ref 96–106)
Creatinine, Ser: 0.78 mg/dL (ref 0.57–1.00)
Globulin, Total: 2.5 g/dL (ref 1.5–4.5)
Glucose: 98 mg/dL (ref 70–99)
Potassium: 4.1 mmol/L (ref 3.5–5.2)
Sodium: 143 mmol/L (ref 134–144)
Total Protein: 7.2 g/dL (ref 6.0–8.5)
eGFR: 85 mL/min/{1.73_m2} (ref >59)

## 2023-08-17 LAB — URINALYSIS, ROUTINE W REFLEX MICROSCOPIC
Bilirubin, UA: NEGATIVE
Glucose, UA: NEGATIVE
Ketones, UA: NEGATIVE
Leukocytes,UA: NEGATIVE
Nitrite, UA: NEGATIVE
Protein,UA: NEGATIVE
RBC, UA: NEGATIVE
Specific Gravity, UA: 1.006 (ref 1.005–1.030)
Urobilinogen, Ur: 0.2 mg/dL (ref 0.2–1.0)
pH, UA: 7 (ref 5.0–7.5)

## 2023-08-17 LAB — HEMOGLOBIN A1C
Est. average glucose Bld gHb Est-mCnc: 114 mg/dL
Hgb A1c MFr Bld: 5.6 % (ref 4.8–5.6)

## 2023-08-17 LAB — TSH: TSH: 1.64 u[IU]/mL (ref 0.450–4.500)

## 2023-08-24 ENCOUNTER — Encounter: Payer: Self-pay | Admitting: Internal Medicine

## 2023-09-03 LAB — COLOGUARD: COLOGUARD: NEGATIVE

## 2023-09-30 ENCOUNTER — Encounter: Payer: Self-pay | Admitting: Internal Medicine

## 2023-10-18 ENCOUNTER — Encounter: Payer: Self-pay | Admitting: Internal Medicine

## 2023-10-18 ENCOUNTER — Ambulatory Visit (INDEPENDENT_AMBULATORY_CARE_PROVIDER_SITE_OTHER): Payer: BC Managed Care – PPO | Admitting: Internal Medicine

## 2023-10-18 VITALS — BP 136/82 | HR 86 | Ht 61.0 in | Wt 161.5 lb

## 2023-10-18 DIAGNOSIS — I10 Essential (primary) hypertension: Secondary | ICD-10-CM | POA: Diagnosis not present

## 2023-10-18 DIAGNOSIS — Z23 Encounter for immunization: Secondary | ICD-10-CM | POA: Diagnosis not present

## 2023-10-18 MED ORDER — LOSARTAN POTASSIUM 25 MG PO TABS
25.0000 mg | ORAL_TABLET | Freq: Every day | ORAL | 0 refills | Status: DC
Start: 2023-10-18 — End: 2023-11-15

## 2023-10-18 NOTE — Progress Notes (Signed)
 Date:  10/18/2023   Name:  Debra Villa   DOB:  May 24, 1961   MRN:  [id number]   Chief Complaint: Hypertension  Hypertension This is a chronic problem. The problem is controlled. Pertinent negatives include no chest pain, headaches, palpitations or shortness of breath. Past treatments include diuretics.    Review of Systems  Constitutional:  Negative for chills, fatigue and unexpected weight change.  HENT:  Negative for trouble swallowing.   Eyes:  Negative for visual disturbance.  Respiratory:  Negative for cough, chest tightness, shortness of breath and wheezing.   Cardiovascular:  Negative for chest pain, palpitations and leg swelling.  Gastrointestinal:  Negative for abdominal pain, constipation and diarrhea.  Musculoskeletal:  Negative for arthralgias and myalgias.  Neurological:  Negative for dizziness, weakness, light-headedness and headaches.     Lab Results  Component Value Date   NA 143 08/16/2023   K 4.1 08/16/2023   CO2 26 08/16/2023   GLUCOSE 98 08/16/2023   BUN 14 08/16/2023   CREATININE 0.78 08/16/2023   CALCIUM 9.7 08/16/2023   EGFR 85 08/16/2023   GFRNONAA 96 12/26/2019   Lab Results  Component Value Date   CHOL 251 (H) 08/16/2023   HDL 73 08/16/2023   LDLCALC 165 (H) 08/16/2023   TRIG 79 08/16/2023   CHOLHDL 3.4 08/16/2023   Lab Results  Component Value Date   TSH 1.640 08/16/2023   Lab Results  Component Value Date   HGBA1C 5.6 08/16/2023   Lab Results  Component Value Date   WBC 5.3 08/16/2023   HGB 13.9 08/16/2023   HCT 42.8 08/16/2023   MCV 88 08/16/2023   PLT 262 08/16/2023   Lab Results  Component Value Date   ALT 24 08/16/2023   AST 19 08/16/2023   ALKPHOS 90 08/16/2023   BILITOT 0.4 08/16/2023   No results found for: "25OHVITD2", "25OHVITD3", "VD25OH"   Patient Active Problem List   Diagnosis Date Noted   Prediabetes 08/19/2022   Mixed hyperlipidemia 02/16/2022   Melanoma in situ (HCC) 12/03/2016   Achrochordon  05/06/2015   Alopecia 05/06/2015   Essential (primary) hypertension 05/06/2015   Gastro-esophageal reflux disease without esophagitis 05/06/2015    Allergies  Allergen Reactions   Wasp Venom Protein Swelling   Hazelnut (Filbert)     Past Surgical History:  Procedure Laterality Date   KNEE ARTHROSCOPY     MOLE REMOVAL      Social History   Tobacco Use   Smoking status: Never   Smokeless tobacco: Never  Vaping Use   Vaping status: Never Used  Substance Use Topics   Alcohol use: Yes    Alcohol/week: 2.0 standard drinks of alcohol    Types: 2 Standard drinks or equivalent per week   Drug use: Never     Medication list has been reviewed and updated.  Current Meds  Medication Sig   Apoaequorin (PREVAGEN) 10 MG CAPS Take 10 mg by mouth daily.   clonazePAM  (KLONOPIN ) 0.5 MG tablet Take 1 tablet (0.5 mg total) by mouth 2 (two) times daily as needed for anxiety.   hydrochlorothiazide  (HYDRODIURIL ) 25 MG tablet TAKE 1 TABLET BY MOUTH DAILY   losartan (COZAAR) 25 MG tablet Take 1 tablet (25 mg total) by mouth daily.   Melatonin 5 MG TABS Take 1 tablet by mouth as needed.    meloxicam  (MOBIC ) 15 MG tablet Take 1 tablet (15 mg total) by mouth daily as needed for pain.   Multiple Vitamins-Minerals (MULTIVITAMIN WOMEN 50+)  TABS Take 1 tablet by mouth daily.       10/18/2023   10:26 AM 08/16/2023    8:06 AM 08/19/2022    9:19 AM 02/16/2022   10:02 AM  GAD 7 : Generalized Anxiety Score  Nervous, Anxious, on Edge 1 1 1 1   Control/stop worrying 1 0 0 0  Worry too much - different things 1 1 1 1   Trouble relaxing 1 1 1  0  Restless 1 1 1 1   Easily annoyed or irritable 1 0 1 0  Afraid - awful might happen 0 0 0 1  Total GAD 7 Score 6 4 5 4   Anxiety Difficulty Not difficult at all Not difficult at all Not difficult at all Not difficult at all       10/18/2023   10:25 AM 08/16/2023    8:05 AM 08/19/2022    9:19 AM  Depression screen PHQ 2/9  Decreased Interest 0 1 1  Down,  Depressed, Hopeless 1 1 1   PHQ - 2 Score 1 2 2   Altered sleeping 1 1 1   Tired, decreased energy 2 1 1   Change in appetite 0 0 0  Feeling bad or failure about yourself  0 0 0  Trouble concentrating 0 0 0  Moving slowly or fidgety/restless 2 0 0  Suicidal thoughts 0 0 0  PHQ-9 Score 6 4 4   Difficult doing work/chores Not difficult at all Not difficult at all Not difficult at all    BP Readings from Last 3 Encounters:  10/18/23 136/82  08/16/23 (!) 145/92  08/19/22 132/82    Physical Exam Vitals and nursing note reviewed.  Constitutional:      General: She is not in acute distress.    Appearance: She is well-developed.  HENT:     Head: Normocephalic and atraumatic.  Cardiovascular:     Rate and Rhythm: Normal rate and regular rhythm.     Heart sounds: No murmur heard. Pulmonary:     Effort: Pulmonary effort is normal. No respiratory distress.     Breath sounds: No wheezing or rhonchi.  Musculoskeletal:     Right wrist: No tenderness or bony tenderness. Normal range of motion.     Left wrist: No tenderness or bony tenderness. Normal range of motion.     Cervical back: Normal range of motion.     Right lower leg: No edema.     Left lower leg: No edema.     Comments: Right - negative Tinel's and Phalen's  Skin:    General: Skin is warm and dry.     Findings: No rash.  Neurological:     Mental Status: She is alert and oriented to person, place, and time.  Psychiatric:        Mood and Affect: Mood normal.        Behavior: Behavior normal.     Wt Readings from Last 3 Encounters:  10/18/23 161 lb 8 oz (73.3 kg)  08/16/23 160 lb (72.6 kg)  08/19/22 162 lb (73.5 kg)    BP 136/82   Pulse 86   Ht 5\' 1"  (1.549 m)   Wt 161 lb 8 oz (73.3 kg)   SpO2 96%   BMI 30.52 kg/m   Assessment and Plan:  Problem List Items Addressed This Visit       Unprioritized   Essential (primary) hypertension - Primary (Chronic)   Blood pressure is fairly controlled. Slightly elevated  last visit but monitored at home - very labile readings  often up to 150 syst. Initial reading elevated here today then improved. Current medications are hydrochlorothiazide  and diet changes.   Will add Losartan 25 mg daily; follow up in 2 months.       Relevant Medications   losartan (COZAAR) 25 MG tablet   Other Visit Diagnoses       Immunization due       Relevant Orders   Pneumococcal conjugate vaccine 20-valent (Completed)       Return in about 2 months (around 12/18/2023) for HTN.    Sheron Dixons, MD [facility] [place]

## 2023-10-18 NOTE — Assessment & Plan Note (Addendum)
 Blood pressure is fairly controlled. Slightly elevated last visit but monitored at home - very labile readings often up to 150 syst. Initial reading elevated here today then improved. Current medications are hydrochlorothiazide  and diet changes.   Will add Losartan 25 mg daily; follow up in 2 months.

## 2023-11-15 ENCOUNTER — Other Ambulatory Visit: Payer: Self-pay | Admitting: Internal Medicine

## 2023-11-15 ENCOUNTER — Encounter: Payer: Self-pay | Admitting: Internal Medicine

## 2023-11-15 DIAGNOSIS — I10 Essential (primary) hypertension: Secondary | ICD-10-CM

## 2023-11-15 MED ORDER — LOSARTAN POTASSIUM 25 MG PO TABS
25.0000 mg | ORAL_TABLET | Freq: Every day | ORAL | 0 refills | Status: DC
Start: 2023-11-15 — End: 2023-12-16

## 2023-11-15 NOTE — Telephone Encounter (Signed)
Patient responded.

## 2023-11-15 NOTE — Telephone Encounter (Signed)
 Please review

## 2023-12-16 ENCOUNTER — Encounter: Payer: Self-pay | Admitting: Internal Medicine

## 2023-12-16 ENCOUNTER — Ambulatory Visit (INDEPENDENT_AMBULATORY_CARE_PROVIDER_SITE_OTHER): Admitting: Internal Medicine

## 2023-12-16 VITALS — BP 122/78 | HR 101 | Ht 61.0 in | Wt 164.0 lb

## 2023-12-16 DIAGNOSIS — F419 Anxiety disorder, unspecified: Secondary | ICD-10-CM | POA: Diagnosis not present

## 2023-12-16 DIAGNOSIS — I10 Essential (primary) hypertension: Secondary | ICD-10-CM

## 2023-12-16 MED ORDER — LOSARTAN POTASSIUM 25 MG PO TABS
25.0000 mg | ORAL_TABLET | Freq: Every day | ORAL | 0 refills | Status: DC
Start: 2023-12-16 — End: 2024-05-14

## 2023-12-16 NOTE — Progress Notes (Signed)
 Date:  12/16/2023   Name:  Charlottie Peragine   DOB:  [birth year]   MRN:  [id number]   Chief Complaint: Hypertension  Hypertension This is a chronic problem. The problem is uncontrolled (last visit). Associated symptoms include anxiety. Pertinent negatives include no chest pain, headaches, palpitations or shortness of breath. Past treatments include angiotensin blockers and diuretics (losartan  added). The current treatment provides significant improvement. There are no compliance problems.  There is no history of kidney disease, CAD/MI or CVA.  Anxiety Presents for follow-up visit. Symptoms include insomnia and nervous/anxious behavior. Patient reports no chest pain, dizziness, palpitations or shortness of breath. Symptoms occur occasionally. The severity of symptoms is mild. The quality of sleep is good.      Review of Systems  Constitutional:  Negative for fatigue and unexpected weight change.  HENT:  Negative for trouble swallowing.   Eyes:  Negative for visual disturbance.  Respiratory:  Negative for cough, chest tightness, shortness of breath and wheezing.   Cardiovascular:  Negative for chest pain, palpitations and leg swelling.  Gastrointestinal:  Negative for abdominal pain, constipation and diarrhea.  Musculoskeletal:  Negative for arthralgias and myalgias.  Neurological:  Negative for dizziness, weakness, light-headedness and headaches.  Psychiatric/Behavioral:  Positive for sleep disturbance. Negative for dysphoric mood. The patient is nervous/anxious and has insomnia.      Lab Results  Component Value Date   NA 143 08/16/2023   K 4.1 08/16/2023   CO2 26 08/16/2023   GLUCOSE 98 08/16/2023   BUN 14 08/16/2023   CREATININE 0.78 08/16/2023   CALCIUM 9.7 08/16/2023   EGFR 85 08/16/2023   GFRNONAA 96 12/26/2019   Lab Results  Component Value Date   CHOL 251 (H) 08/16/2023   HDL 73 08/16/2023   LDLCALC 165 (H) 08/16/2023   TRIG 79 08/16/2023   CHOLHDL 3.4 08/16/2023    Lab Results  Component Value Date   TSH 1.640 08/16/2023   Lab Results  Component Value Date   HGBA1C 5.6 08/16/2023   Lab Results  Component Value Date   WBC 5.3 08/16/2023   HGB 13.9 08/16/2023   HCT 42.8 08/16/2023   MCV 88 08/16/2023   PLT 262 08/16/2023   Lab Results  Component Value Date   ALT 24 08/16/2023   AST 19 08/16/2023   ALKPHOS 90 08/16/2023   BILITOT 0.4 08/16/2023   No results found for: MARIEN BOLLS, VD25OH   Patient Active Problem List   Diagnosis Date Noted   Anxiety 12/16/2023   Prediabetes 08/19/2022   Mixed hyperlipidemia 02/16/2022   Melanoma in situ (HCC) 12/03/2016   Achrochordon 05/06/2015   Alopecia 05/06/2015   Essential (primary) hypertension 05/06/2015   Gastro-esophageal reflux disease without esophagitis 05/06/2015    Allergies  Allergen Reactions   Wasp Venom Protein Swelling   Hazelnut (Filbert)     Past Surgical History:  Procedure Laterality Date   KNEE ARTHROSCOPY     MOLE REMOVAL      Social History   Tobacco Use   Smoking status: Never   Smokeless tobacco: Never  Vaping Use   Vaping status: Never Used  Substance Use Topics   Alcohol use: Yes    Alcohol/week: 2.0 standard drinks of alcohol    Types: 2 Standard drinks or equivalent per week   Drug use: Never     Medication list has been reviewed and updated.  Current Meds  Medication Sig   Apoaequorin (PREVAGEN) 10 MG CAPS Take 10 mg by  mouth daily.   clonazePAM  (KLONOPIN ) 0.5 MG tablet Take 1 tablet (0.5 mg total) by mouth 2 (two) times daily as needed for anxiety. (Patient taking differently: Take 0.25 mg by mouth 2 (two) times daily as needed for anxiety.)   hydrochlorothiazide  (HYDRODIURIL ) 25 MG tablet TAKE 1 TABLET BY MOUTH DAILY   Melatonin 5 MG TABS Take 1 tablet by mouth as needed.    meloxicam  (MOBIC ) 15 MG tablet Take 1 tablet (15 mg total) by mouth daily as needed for pain.   Multiple Vitamins-Minerals (MULTIVITAMIN WOMEN 50+)  TABS Take 1 tablet by mouth daily.   [DISCONTINUED] losartan  (COZAAR ) 25 MG tablet Take 1 tablet (25 mg total) by mouth daily.       12/16/2023    9:49 AM 12/16/2023    9:48 AM 10/18/2023   10:26 AM 08/16/2023    8:06 AM  GAD 7 : Generalized Anxiety Score  Nervous, Anxious, on Edge 1 0 1 1  Control/stop worrying 1 0 1 0  Worry too much - different things 1 0 1 1  Trouble relaxing 1 0 1 1  Restless 1 0 1 1  Easily annoyed or irritable 1 0 1 0  Afraid - awful might happen 1 0 0 0  Total GAD 7 Score 7 0 6 4  Anxiety Difficulty Somewhat difficult Not difficult at all Not difficult at all Not difficult at all       12/16/2023    9:48 AM 10/18/2023   10:25 AM 08/16/2023    8:05 AM  Depression screen PHQ 2/9  Decreased Interest 0 0 1  Down, Depressed, Hopeless 0 1 1  PHQ - 2 Score 0 1 2  Altered sleeping 0 1 1  Tired, decreased energy 0 2 1  Change in appetite 0 0 0  Feeling bad or failure about yourself  0 0 0  Trouble concentrating 0 0 0  Moving slowly or fidgety/restless 0 2 0  Suicidal thoughts 0 0 0  PHQ-9 Score 0 6 4  Difficult doing work/chores Not difficult at all Not difficult at all Not difficult at all    BP Readings from Last 3 Encounters:  12/16/23 122/78  10/18/23 136/82  08/16/23 (!) 145/92    Physical Exam Vitals and nursing note reviewed.  Constitutional:      General: She is not in acute distress.    Appearance: She is well-developed.  HENT:     Head: Normocephalic and atraumatic.   Cardiovascular:     Rate and Rhythm: Normal rate and regular rhythm.     Heart sounds: No murmur heard. Pulmonary:     Effort: Pulmonary effort is normal. No respiratory distress.     Breath sounds: No wheezing or rhonchi.   Musculoskeletal:     Cervical back: Normal range of motion.     Right lower leg: No edema.     Left lower leg: Edema (mild intermittent) present.  Lymphadenopathy:     Cervical: No cervical adenopathy.   Skin:    General: Skin is warm and dry.      Findings: No rash.   Neurological:     General: No focal deficit present.     Mental Status: She is alert and oriented to person, place, and time.   Psychiatric:        Mood and Affect: Mood normal.        Behavior: Behavior normal.     Wt Readings from Last 3 Encounters:  12/16/23 164 lb (  74.4 kg)  10/18/23 161 lb 8 oz (73.3 kg)  08/16/23 160 lb (72.6 kg)    BP 122/78   Pulse (!) 101   Ht 5' 1 (1.549 m)   Wt 164 lb (74.4 kg)   SpO2 99%   BMI 30.99 kg/m   Assessment and Plan:  Problem List Items Addressed This Visit       Unprioritized   Essential (primary) hypertension - Primary (Chronic)   Blood pressure has improved.   Current medications hydrochlorothiazide  - losartan  added last visit.  No side effects noted. Home BP < 130/80 Will continue same regimen along with efforts to limit dietary sodium.       Relevant Medications   losartan  (COZAAR ) 25 MG tablet   Anxiety (Chronic)   Mild anxiety and insomnia since her husband passed away Generally takes 1/2 tablet about once per week No evidence of misuse.       Return in about 4 months (around 04/16/2024) for HTN.    Leita HILARIO Adie, MD [facility] [place]

## 2023-12-16 NOTE — Assessment & Plan Note (Addendum)
 Blood pressure has improved.   Current medications hydrochlorothiazide  - losartan  added last visit.  No side effects noted. Home BP < 130/80 Will continue same regimen along with efforts to limit dietary sodium.

## 2023-12-16 NOTE — Assessment & Plan Note (Signed)
 Mild anxiety and insomnia since her husband passed away Generally takes 1/2 tablet about once per week No evidence of misuse.

## 2023-12-28 ENCOUNTER — Ambulatory Visit
Admission: RE | Admit: 2023-12-28 | Discharge: 2023-12-28 | Disposition: A | Discharge status: Home / Self Care | Source: Ambulatory Visit | Attending: Internal Medicine | Admitting: Internal Medicine

## 2023-12-28 DIAGNOSIS — Z1231 Encounter for screening mammogram for malignant neoplasm of breast: Secondary | ICD-10-CM | POA: Diagnosis not present

## 2024-01-31 DIAGNOSIS — H5213 Myopia, bilateral: Secondary | ICD-10-CM | POA: Diagnosis not present

## 2024-01-31 DIAGNOSIS — H40013 Open angle with borderline findings, low risk, bilateral: Secondary | ICD-10-CM | POA: Diagnosis not present

## 2024-03-28 ENCOUNTER — Other Ambulatory Visit: Payer: Self-pay | Admitting: Internal Medicine

## 2024-03-28 DIAGNOSIS — I10 Essential (primary) hypertension: Secondary | ICD-10-CM

## 2024-03-30 NOTE — Telephone Encounter (Signed)
 Requested Prescriptions  Pending Prescriptions Disp Refills   hydrochlorothiazide  (HYDRODIURIL ) 25 MG tablet [Pharmacy Med Name: HYDROCHLOROTHIAZIDE  25 MG TAB] 90 tablet 1    Sig: TAKE 1 TABLET BY MOUTH DAILY     Cardiovascular: Diuretics - Thiazide Failed - 03/30/2024 10:02 AM      Failed - Cr in normal range and within 180 days    Creatinine, Ser  Date Value Ref Range Status  08/16/2023 0.78 0.57 - 1.00 mg/dL Final         Failed - K in normal range and within 180 days    Potassium  Date Value Ref Range Status  08/16/2023 4.1 3.5 - 5.2 mmol/L Final         Failed - Na in normal range and within 180 days    Sodium  Date Value Ref Range Status  08/16/2023 143 134 - 144 mmol/L Final         Passed - Last BP in normal range    BP Readings from Last 1 Encounters:  12/16/23 122/78         Passed - Valid encounter within last 6 months    Recent Outpatient Visits           3 months ago Essential (primary) hypertension   [place] [facility] at [facility], Leita DEL, MD   5 months ago Essential (primary) hypertension   [place] [facility] at [facility], Leita DEL, MD   7 months ago Annual physical exam   [facility] at [facility], Leita DEL, MD       Future Appointments             In 2 months Justus, Leita DEL, MD [facility] at [facility], [phone] [place]

## 2024-05-04 ENCOUNTER — Ambulatory Visit (INDEPENDENT_AMBULATORY_CARE_PROVIDER_SITE_OTHER)

## 2024-05-04 DIAGNOSIS — Z23 Encounter for immunization: Secondary | ICD-10-CM

## 2024-05-09 ENCOUNTER — Other Ambulatory Visit: Payer: Self-pay | Admitting: Internal Medicine

## 2024-05-09 DIAGNOSIS — I10 Essential (primary) hypertension: Secondary | ICD-10-CM

## 2024-05-11 NOTE — Telephone Encounter (Signed)
 Requested medications are due for refill today.  yes  Requested medications are on the active medications list.  yes  Last refill. 12/16/2023 #90   Future visit scheduled.   yes  Notes to clinic.  Labs are expired.    Requested Prescriptions  Pending Prescriptions Disp Refills   losartan  (COZAAR ) 25 MG tablet [Pharmacy Med Name: LOSARTAN  POTASSIUM 25 MG TAB] 90 tablet 0    Sig: TAKE 1 TABLET BY MOUTH ONCE DAILY     Cardiovascular:  Angiotensin Receptor Blockers Failed - 05/11/2024  3:54 PM      Failed - Cr in normal range and within 180 days    Creatinine, Ser  Date Value Ref Range Status  08/16/2023 0.78 0.57 - 1.00 mg/dL Final         Failed - K in normal range and within 180 days    Potassium  Date Value Ref Range Status  08/16/2023 4.1 3.5 - 5.2 mmol/L Final         Passed - Patient is not pregnant      Passed - Last BP in normal range    BP Readings from Last 1 Encounters:  12/16/23 122/78         Passed - Valid encounter within last 6 months    Recent Outpatient Visits           4 months ago Essential (primary) hypertension   [place] [facility] at [facility], Leita DEL, MD   6 months ago Essential (primary) hypertension   [place] [facility] at [facility], Leita DEL, MD   8 months ago Annual physical exam   [facility] at [facility], Leita DEL, MD       Future Appointments             In 3 weeks Justus Leita DEL, MD [facility] at [facility], [phone] [place]

## 2024-05-14 ENCOUNTER — Encounter: Payer: Self-pay | Admitting: Internal Medicine

## 2024-06-06 ENCOUNTER — Ambulatory Visit: Admitting: Internal Medicine

## 2024-06-06 ENCOUNTER — Encounter: Payer: Self-pay | Admitting: Internal Medicine

## 2024-06-06 VITALS — BP 128/78 | HR 84 | Ht 61.0 in | Wt 159.0 lb

## 2024-06-06 DIAGNOSIS — I10 Essential (primary) hypertension: Secondary | ICD-10-CM | POA: Diagnosis not present

## 2024-06-06 DIAGNOSIS — F419 Anxiety disorder, unspecified: Secondary | ICD-10-CM | POA: Diagnosis not present

## 2024-06-06 DIAGNOSIS — R7303 Prediabetes: Secondary | ICD-10-CM

## 2024-06-06 MED ORDER — CLONAZEPAM 0.5 MG PO TABS: 0.2500 mg | ORAL_TABLET | Freq: Two times a day (BID) | ORAL | 0 refills | Status: AC | PRN

## 2024-06-06 NOTE — Assessment & Plan Note (Addendum)
 Controlled with diet only. She has lost more weight with walking and diet changes. Recommend rechecking A1C next visit

## 2024-06-06 NOTE — Progress Notes (Signed)
 Date:  06/06/2024   Name:  Debra Villa   DOB:  Jul 01, 1960   MRN:  [id number]   Chief Complaint: Diabetes and Hypertension  Hypertension This is a chronic problem. The problem is controlled. Pertinent negatives include no chest pain, headaches, palpitations or shortness of breath. Past treatments include diuretics and angiotensin blockers. The current treatment provides significant improvement.  Diabetes She presents for her follow-up diabetic visit. Diabetes type: prediabetes. Pertinent negatives for hypoglycemia include no dizziness or headaches. Pertinent negatives for diabetes include no chest pain, no fatigue and no weakness.    Review of Systems  Constitutional:  Negative for fatigue and unexpected weight change.  HENT:  Negative for trouble swallowing.   Eyes:  Negative for visual disturbance.  Respiratory:  Negative for cough, chest tightness, shortness of breath and wheezing.   Cardiovascular:  Negative for chest pain, palpitations and leg swelling.  Gastrointestinal:  Negative for abdominal pain, constipation and diarrhea.  Musculoskeletal:  Negative for arthralgias and myalgias.  Neurological:  Negative for dizziness, weakness, light-headedness and headaches.     Lab Results  Component Value Date   NA 143 08/16/2023   K 4.1 08/16/2023   CO2 26 08/16/2023   GLUCOSE 98 08/16/2023   BUN 14 08/16/2023   CREATININE 0.78 08/16/2023   CALCIUM 9.7 08/16/2023   EGFR 85 08/16/2023   GFRNONAA 96 12/26/2019   Lab Results  Component Value Date   CHOL 251 (H) 08/16/2023   HDL 73 08/16/2023   LDLCALC 165 (H) 08/16/2023   TRIG 79 08/16/2023   CHOLHDL 3.4 08/16/2023   Lab Results  Component Value Date   TSH 1.640 08/16/2023   Lab Results  Component Value Date   HGBA1C 5.6 08/16/2023   Lab Results  Component Value Date   WBC 5.3 08/16/2023   HGB 13.9 08/16/2023   HCT 42.8 08/16/2023   MCV 88 08/16/2023   PLT 262 08/16/2023   Lab Results  Component Value  Date   ALT 24 08/16/2023   AST 19 08/16/2023   ALKPHOS 90 08/16/2023   BILITOT 0.4 08/16/2023   No results found for: MARIEN BOLLS, VD25OH   Patient Active Problem List   Diagnosis Date Noted   Anxiety 12/16/2023   Prediabetes 08/19/2022   Mixed hyperlipidemia 02/16/2022   Melanoma in situ (HCC) 12/03/2016   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  Active Medications[3]     06/06/2024    9:50 AM 12/16/2023    9:49 AM 12/16/2023    9:48 AM 10/18/2023   10:26 AM  GAD 7 : Generalized Anxiety Score  Nervous, Anxious, on Edge 2 1 0 1  Control/stop worrying 2 1 0 1  Worry too much - different things 1 1 0 1  Trouble relaxing 0 1 0 1  Restless 2 1 0 1  Easily annoyed or irritable 0 1 0 1  Afraid - awful might happen 0 1 0 0  Total GAD 7 Score 7 7 0 6  Anxiety Difficulty Not difficult at all Somewhat difficult Not difficult at all Not difficult at all       06/06/2024    9:48 AM 12/16/2023    9:48  AM 10/18/2023   10:25 AM  Depression screen PHQ 2/9  Decreased Interest 0 0 0  Down, Depressed, Hopeless 2 0 1  PHQ - 2 Score 2 0 1  Altered sleeping 0 0 1  Tired, decreased energy 1 0 2  Change in appetite 0 0 0  Feeling bad or failure about yourself  0 0 0  Trouble concentrating 0 0 0  Moving slowly or fidgety/restless 0 0 2  Suicidal thoughts 0 0 0  PHQ-9 Score 3 0  6   Difficult doing work/chores Not difficult at all Not difficult at all Not difficult at all     Data saved with a previous flowsheet row definition    BP Readings from Last 3 Encounters:  06/06/24 128/78  12/16/23 122/78  10/18/23 136/82    Physical Exam Vitals and nursing note  reviewed.  Constitutional:      General: She is not in acute distress.    Appearance: Normal appearance. She is well-developed.  HENT:     Head: Normocephalic and atraumatic.  Neck:     Vascular: No carotid bruit.  Cardiovascular:     Rate and Rhythm: Normal rate and regular rhythm.     Heart sounds: No murmur heard. Pulmonary:     Effort: Pulmonary effort is normal. No respiratory distress.     Breath sounds: No wheezing or rhonchi.  Musculoskeletal:     Cervical back: Normal range of motion.     Right lower leg: No edema.     Left lower leg: No edema.  Lymphadenopathy:     Cervical: No cervical adenopathy.  Skin:    General: Skin is warm and dry.     Findings: No rash.  Neurological:     Mental Status: She is alert and oriented to person, place, and time.  Psychiatric:        Mood and Affect: Mood normal.        Behavior: Behavior normal.     Wt Readings from Last 3 Encounters:  06/06/24 159 lb (72.1 kg)  12/16/23 164 lb (74.4 kg)  10/18/23 161 lb 8 oz (73.3 kg)    BP 128/78   Pulse 84   Ht 5' 1 (1.549 m)   Wt 159 lb (72.1 kg)   SpO2 99%   BMI 30.04 kg/m   Assessment and Plan:  Problem List Items Addressed This Visit       Unprioritized   Essential (primary) hypertension - Primary (Chronic)   Well controlled blood pressure today. Current regimen is hydrochlorothiazide  and losartan . No medication side effects noted.        Prediabetes   Controlled with diet only. She has lost more weight with walking and diet changes. Recommend rechecking A1C next visit      Anxiety (Chronic)   Mild anxiety due to recent death of her brother - not unexpected but she is having to help with affairs      Relevant Medications   clonazePAM  (KLONOPIN ) 0.5 MG tablet    No follow-ups on file.    Leita HILARIO Adie, MD [facility] [place]           [1]  Allergies Allergen Reactions   Wasp Venom Protein Swelling    Hazelnut Gayla)   [2]  Social History Tobacco Use   Smoking status: Never   Smokeless tobacco: Never  Vaping Use   Vaping status: Never Used  Substance Use Topics   Alcohol use: Yes  Alcohol/week: 2.0 standard drinks of alcohol    Types: 2 Standard drinks or equivalent per week   Drug use: Never  [3]  Current Meds  Medication Sig   Apoaequorin (PREVAGEN) 10 MG CAPS Take 10 mg by mouth daily.   hydrochlorothiazide  (HYDRODIURIL ) 25 MG tablet TAKE 1 TABLET BY MOUTH DAILY   losartan  (COZAAR ) 25 MG tablet TAKE 1 TABLET BY MOUTH ONCE DAILY   Melatonin 5 MG TABS Take 1 tablet by mouth as needed.    meloxicam  (MOBIC ) 15 MG tablet Take 1 tablet (15 mg total) by mouth daily as needed for pain.   Multiple Vitamins-Minerals (MULTIVITAMIN WOMEN 50+) TABS Take 1 tablet by mouth daily.   [DISCONTINUED] clonazePAM  (KLONOPIN ) 0.5 MG tablet Take 1 tablet (0.5 mg total) by mouth 2 (two) times daily as needed for anxiety. (Patient taking differently: Take 0.25 mg by mouth 2 (two) times daily as needed for anxiety.)

## 2024-06-06 NOTE — Assessment & Plan Note (Signed)
 Well controlled blood pressure today. Current regimen is hydrochlorothiazide  and losartan . No medication side effects noted.

## 2024-06-06 NOTE — Assessment & Plan Note (Signed)
 Mild anxiety due to recent death of her brother - not unexpected but she is having to help with affairs

## 2024-08-16 ENCOUNTER — Encounter: Admitting: Physician Assistant
